# Patient Record
Sex: Male | Born: 1935 | Race: White | Hispanic: No | Marital: Married | Smoking: Former smoker
Health system: Southern US, Community
[De-identification: ages and names within clinical notes are randomized; demographics above are authoritative.]

## PROBLEM LIST (undated history)

## (undated) DIAGNOSIS — I219 Acute myocardial infarction, unspecified: Secondary | ICD-10-CM

## (undated) DIAGNOSIS — I509 Heart failure, unspecified: Secondary | ICD-10-CM

## (undated) DIAGNOSIS — I639 Cerebral infarction, unspecified: Secondary | ICD-10-CM

## (undated) DIAGNOSIS — M109 Gout, unspecified: Secondary | ICD-10-CM

## (undated) DIAGNOSIS — I255 Ischemic cardiomyopathy: Secondary | ICD-10-CM

## (undated) DIAGNOSIS — M1990 Unspecified osteoarthritis, unspecified site: Secondary | ICD-10-CM

## (undated) DIAGNOSIS — F0390 Unspecified dementia without behavioral disturbance: Secondary | ICD-10-CM

---

## 2004-12-23 ENCOUNTER — Ambulatory Visit: Payer: Self-pay | Admitting: Physical Medicine & Rehabilitation

## 2004-12-23 ENCOUNTER — Inpatient Hospital Stay (HOSPITAL_COMMUNITY): Admission: EM | Admit: 2004-12-23 | Discharge: 2005-01-01 | Payer: Self-pay | Admitting: Emergency Medicine

## 2004-12-24 ENCOUNTER — Encounter: Payer: Self-pay | Admitting: Cardiology

## 2004-12-24 ENCOUNTER — Ambulatory Visit: Payer: Self-pay | Admitting: Cardiology

## 2004-12-25 ENCOUNTER — Ambulatory Visit: Payer: Self-pay | Admitting: Internal Medicine

## 2005-01-01 ENCOUNTER — Ambulatory Visit: Payer: Self-pay | Admitting: Physical Medicine & Rehabilitation

## 2005-01-01 ENCOUNTER — Inpatient Hospital Stay (HOSPITAL_COMMUNITY)
Admission: RE | Admit: 2005-01-01 | Discharge: 2005-01-15 | Payer: Self-pay | Admitting: Physical Medicine & Rehabilitation

## 2005-07-31 ENCOUNTER — Emergency Department (HOSPITAL_COMMUNITY): Admission: EM | Admit: 2005-07-31 | Discharge: 2005-07-31 | Payer: Self-pay | Admitting: *Deleted

## 2005-11-15 ENCOUNTER — Emergency Department (HOSPITAL_COMMUNITY): Admission: EM | Admit: 2005-11-15 | Discharge: 2005-11-15 | Payer: Self-pay | Admitting: Emergency Medicine

## 2006-06-11 ENCOUNTER — Emergency Department (HOSPITAL_COMMUNITY): Admission: EM | Admit: 2006-06-11 | Discharge: 2006-06-11 | Payer: Self-pay | Admitting: Emergency Medicine

## 2006-11-19 ENCOUNTER — Emergency Department (HOSPITAL_COMMUNITY): Admission: EM | Admit: 2006-11-19 | Discharge: 2006-11-19 | Payer: Self-pay | Admitting: Emergency Medicine

## 2009-04-01 ENCOUNTER — Inpatient Hospital Stay (HOSPITAL_BASED_OUTPATIENT_CLINIC_OR_DEPARTMENT_OTHER): Admission: RE | Admit: 2009-04-01 | Discharge: 2009-04-01 | Payer: Self-pay | Admitting: Cardiology

## 2009-04-02 ENCOUNTER — Ambulatory Visit: Payer: Self-pay | Admitting: Surgery

## 2009-04-03 ENCOUNTER — Ambulatory Visit: Payer: Self-pay | Admitting: Vascular Surgery

## 2009-04-03 ENCOUNTER — Encounter: Payer: Self-pay | Admitting: Surgery

## 2009-04-03 ENCOUNTER — Ambulatory Visit (HOSPITAL_COMMUNITY): Admission: RE | Admit: 2009-04-03 | Discharge: 2009-04-03 | Payer: Self-pay | Admitting: Surgery

## 2009-04-09 ENCOUNTER — Ambulatory Visit: Payer: Self-pay | Admitting: Surgery

## 2009-04-26 HISTORY — PX: CARDIAC SURGERY: SHX584

## 2009-05-09 ENCOUNTER — Ambulatory Visit: Payer: Self-pay | Admitting: Surgery

## 2009-05-20 ENCOUNTER — Ambulatory Visit: Payer: Self-pay | Admitting: Surgery

## 2009-05-20 ENCOUNTER — Inpatient Hospital Stay (HOSPITAL_COMMUNITY): Admission: RE | Admit: 2009-05-20 | Discharge: 2009-06-03 | Payer: Self-pay | Admitting: Surgery

## 2009-05-24 ENCOUNTER — Ambulatory Visit: Payer: Self-pay | Admitting: Physical Medicine & Rehabilitation

## 2009-06-18 ENCOUNTER — Encounter: Admission: RE | Admit: 2009-06-18 | Discharge: 2009-06-18 | Payer: Self-pay | Admitting: Surgery

## 2009-06-18 ENCOUNTER — Ambulatory Visit: Payer: Self-pay | Admitting: Surgery

## 2009-07-23 ENCOUNTER — Ambulatory Visit: Payer: Self-pay | Admitting: Surgery

## 2009-07-23 ENCOUNTER — Encounter: Admission: RE | Admit: 2009-07-23 | Discharge: 2009-07-23 | Payer: Self-pay | Admitting: Surgery

## 2009-07-26 ENCOUNTER — Ambulatory Visit (HOSPITAL_COMMUNITY): Admission: RE | Admit: 2009-07-26 | Discharge: 2009-07-26 | Payer: Self-pay | Admitting: Cardiology

## 2009-09-23 ENCOUNTER — Observation Stay (HOSPITAL_COMMUNITY): Admission: RE | Admit: 2009-09-23 | Discharge: 2009-09-24 | Payer: Self-pay | Admitting: Cardiology

## 2009-09-23 HISTORY — PX: CARDIAC DEFIBRILLATOR PLACEMENT: SHX171

## 2009-11-14 ENCOUNTER — Encounter (HOSPITAL_COMMUNITY)
Admission: RE | Admit: 2009-11-14 | Discharge: 2010-02-12 | Payer: Self-pay | Source: Home / Self Care | Attending: Cardiology | Admitting: Cardiology

## 2010-02-13 ENCOUNTER — Encounter (HOSPITAL_COMMUNITY)
Admission: RE | Admit: 2010-02-13 | Discharge: 2010-02-25 | Payer: Self-pay | Source: Home / Self Care | Attending: Cardiology | Admitting: Cardiology

## 2010-02-26 ENCOUNTER — Ambulatory Visit (HOSPITAL_COMMUNITY): Payer: Self-pay

## 2010-02-28 ENCOUNTER — Ambulatory Visit (HOSPITAL_COMMUNITY): Payer: Self-pay

## 2010-03-03 ENCOUNTER — Ambulatory Visit (HOSPITAL_COMMUNITY): Payer: Self-pay

## 2010-03-05 ENCOUNTER — Ambulatory Visit (HOSPITAL_COMMUNITY): Payer: Self-pay

## 2010-03-07 ENCOUNTER — Ambulatory Visit (HOSPITAL_COMMUNITY): Payer: Self-pay

## 2010-03-10 ENCOUNTER — Ambulatory Visit (HOSPITAL_COMMUNITY): Payer: Self-pay

## 2010-03-12 ENCOUNTER — Ambulatory Visit (HOSPITAL_COMMUNITY): Payer: Self-pay

## 2010-03-14 ENCOUNTER — Ambulatory Visit (HOSPITAL_COMMUNITY): Payer: Self-pay

## 2010-03-17 ENCOUNTER — Ambulatory Visit (HOSPITAL_COMMUNITY): Payer: Self-pay

## 2010-03-19 ENCOUNTER — Ambulatory Visit (HOSPITAL_COMMUNITY): Payer: Self-pay

## 2010-03-21 ENCOUNTER — Ambulatory Visit (HOSPITAL_COMMUNITY): Payer: Self-pay

## 2010-03-24 ENCOUNTER — Ambulatory Visit (HOSPITAL_COMMUNITY): Payer: Self-pay

## 2010-03-26 ENCOUNTER — Ambulatory Visit (HOSPITAL_COMMUNITY): Payer: Self-pay

## 2010-03-28 ENCOUNTER — Ambulatory Visit (HOSPITAL_COMMUNITY): Payer: Self-pay

## 2010-03-31 ENCOUNTER — Ambulatory Visit (HOSPITAL_COMMUNITY): Payer: Self-pay

## 2010-04-02 ENCOUNTER — Ambulatory Visit (HOSPITAL_COMMUNITY): Payer: Self-pay

## 2010-04-04 ENCOUNTER — Ambulatory Visit (HOSPITAL_COMMUNITY): Payer: Self-pay

## 2010-04-07 ENCOUNTER — Ambulatory Visit (HOSPITAL_COMMUNITY): Payer: Self-pay

## 2010-04-09 ENCOUNTER — Ambulatory Visit (HOSPITAL_COMMUNITY): Payer: Self-pay

## 2010-04-11 ENCOUNTER — Ambulatory Visit (HOSPITAL_COMMUNITY): Payer: Self-pay

## 2010-04-11 LAB — PROTIME-INR
INR: 1.27 (ref 0.00–1.49)
Prothrombin Time: 16.1 seconds — ABNORMAL HIGH (ref 11.6–15.2)

## 2010-04-11 LAB — SURGICAL PCR SCREEN
MRSA, PCR: NEGATIVE
Staphylococcus aureus: POSITIVE — AB

## 2010-04-13 LAB — CBC
HCT: 41.7 % (ref 39.0–52.0)
Hemoglobin: 14.2 g/dL (ref 13.0–17.0)
MCH: 32.3 pg (ref 26.0–34.0)
MCHC: 34 g/dL (ref 30.0–36.0)
MCV: 95.1 fL (ref 78.0–100.0)
Platelets: 142 10*3/uL — ABNORMAL LOW (ref 150–400)
RBC: 4.39 MIL/uL (ref 4.22–5.81)
RDW: 14.2 % (ref 11.5–15.5)
WBC: 5.1 10*3/uL (ref 4.0–10.5)

## 2010-04-13 LAB — BASIC METABOLIC PANEL
BUN: 17 mg/dL (ref 6–23)
CO2: 24 mEq/L (ref 19–32)
Calcium: 9.1 mg/dL (ref 8.4–10.5)
Chloride: 107 mEq/L (ref 96–112)
Creatinine, Ser: 0.8 mg/dL (ref 0.4–1.5)
GFR calc Af Amer: >60 mL/min (ref >60)
GFR calc non Af Amer: >60 mL/min (ref >60)
Glucose, Bld: 100 mg/dL — ABNORMAL HIGH (ref 70–99)
Potassium: 4.4 mEq/L (ref 3.5–5.1)
Sodium: 139 mEq/L (ref 135–145)

## 2010-04-13 LAB — PROTIME-INR
INR: 2.29 — ABNORMAL HIGH (ref 0.00–1.49)
Prothrombin Time: 25 seconds — ABNORMAL HIGH (ref 11.6–15.2)

## 2010-04-13 LAB — MAGNESIUM: Magnesium: 2.2 mg/dL (ref 1.5–2.5)

## 2010-04-13 LAB — APTT: aPTT: 42 seconds — ABNORMAL HIGH (ref 24–37)

## 2010-04-14 ENCOUNTER — Ambulatory Visit (HOSPITAL_COMMUNITY): Payer: Self-pay

## 2010-04-15 LAB — POCT I-STAT, CHEM 8
BUN: 14 mg/dL (ref 6–23)
Calcium, Ion: 1.17 mmol/L (ref 1.12–1.32)
Creatinine, Ser: 0.7 mg/dL (ref 0.4–1.5)
HCT: 36 % — ABNORMAL LOW (ref 39.0–52.0)
Hemoglobin: 12.2 g/dL — ABNORMAL LOW (ref 13.0–17.0)
Potassium: 4.1 mEq/L (ref 3.5–5.1)
Sodium: 140 mEq/L (ref 135–145)
TCO2: 19 mmol/L (ref 0–100)
TCO2: 24 mmol/L (ref 0–100)

## 2010-04-15 LAB — POCT I-STAT 4, (NA,K, GLUC, HGB,HCT)
Glucose, Bld: 103 mg/dL — ABNORMAL HIGH (ref 70–99)
Glucose, Bld: 114 mg/dL — ABNORMAL HIGH (ref 70–99)
Glucose, Bld: 117 mg/dL — ABNORMAL HIGH (ref 70–99)
HCT: 31 % — ABNORMAL LOW (ref 39.0–52.0)
HCT: 32 % — ABNORMAL LOW (ref 39.0–52.0)
HCT: 37 % — ABNORMAL LOW (ref 39.0–52.0)
Hemoglobin: 10.9 g/dL — ABNORMAL LOW (ref 13.0–17.0)
Hemoglobin: 12.6 g/dL — ABNORMAL LOW (ref 13.0–17.0)
Hemoglobin: 9.5 g/dL — ABNORMAL LOW (ref 13.0–17.0)
Potassium: 3.7 mEq/L (ref 3.5–5.1)
Potassium: 4.7 mEq/L (ref 3.5–5.1)
Sodium: 138 mEq/L (ref 135–145)
Sodium: 142 mEq/L (ref 135–145)

## 2010-04-15 LAB — BASIC METABOLIC PANEL
BUN: 20 mg/dL (ref 6–23)
BUN: 22 mg/dL (ref 6–23)
BUN: 13 mg/dL (ref 6–23)
CO2: 26 mEq/L (ref 19–32)
CO2: 27 mEq/L (ref 19–32)
Calcium: 8.6 mg/dL (ref 8.4–10.5)
Calcium: 8 mg/dL — ABNORMAL LOW (ref 8.4–10.5)
Calcium: 8.2 mg/dL — ABNORMAL LOW (ref 8.4–10.5)
Calcium: 8.2 mg/dL — ABNORMAL LOW (ref 8.4–10.5)
Chloride: 106 mEq/L (ref 96–112)
Chloride: 109 mEq/L (ref 96–112)
Creatinine, Ser: 0.97 mg/dL (ref 0.4–1.5)
Creatinine, Ser: 1.05 mg/dL (ref 0.4–1.5)
Creatinine, Ser: 0.71 mg/dL (ref 0.4–1.5)
Creatinine, Ser: 0.72 mg/dL (ref 0.4–1.5)
GFR calc Af Amer: >60 mL/min (ref >60)
GFR calc Af Amer: >60 mL/min (ref >60)
GFR calc Af Amer: >60 mL/min (ref >60)
GFR calc non Af Amer: >60 mL/min (ref >60)
GFR calc non Af Amer: >60 mL/min (ref >60)
GFR calc non Af Amer: >60 mL/min (ref >60)
Glucose, Bld: 110 mg/dL — ABNORMAL HIGH (ref 70–99)
Glucose, Bld: 100 mg/dL — ABNORMAL HIGH (ref 70–99)
Glucose, Bld: 132 mg/dL — ABNORMAL HIGH (ref 70–99)
Potassium: 4.4 mEq/L (ref 3.5–5.1)
Potassium: 3.7 mEq/L (ref 3.5–5.1)
Potassium: 4.2 mEq/L (ref 3.5–5.1)
Sodium: 140 mEq/L (ref 135–145)
Sodium: 143 mEq/L (ref 135–145)

## 2010-04-15 LAB — GLUCOSE, CAPILLARY
Glucose-Capillary: 107 mg/dL — ABNORMAL HIGH (ref 70–99)
Glucose-Capillary: 109 mg/dL — ABNORMAL HIGH (ref 70–99)
Glucose-Capillary: 126 mg/dL — ABNORMAL HIGH (ref 70–99)
Glucose-Capillary: 131 mg/dL — ABNORMAL HIGH (ref 70–99)
Glucose-Capillary: 95 mg/dL (ref 70–99)

## 2010-04-15 LAB — BLOOD GAS, ARTERIAL
Bicarbonate: 21.8 mEq/L (ref 20.0–24.0)
Drawn by: 206361
FIO2: 0.21 %
pCO2 arterial: 32.9 mmHg — ABNORMAL LOW (ref 35.0–45.0)
pH, Arterial: 7.436 (ref 7.350–7.450)
pO2, Arterial: 86.7 mmHg (ref 80.0–100.0)

## 2010-04-15 LAB — CBC
HCT: 31.8 % — ABNORMAL LOW (ref 39.0–52.0)
HCT: 33.1 % — ABNORMAL LOW (ref 39.0–52.0)
HCT: 34.4 % — ABNORMAL LOW (ref 39.0–52.0)
Hemoglobin: 11.1 g/dL — ABNORMAL LOW (ref 13.0–17.0)
Hemoglobin: 11.5 g/dL — ABNORMAL LOW (ref 13.0–17.0)
Hemoglobin: 11.6 g/dL — ABNORMAL LOW (ref 13.0–17.0)
Hemoglobin: 12.6 g/dL — ABNORMAL LOW (ref 13.0–17.0)
Hemoglobin: 15 g/dL (ref 13.0–17.0)
MCHC: 35.3 g/dL (ref 30.0–36.0)
MCV: 97.8 fL (ref 78.0–100.0)
MCV: 98.2 fL (ref 78.0–100.0)
Platelets: 100 10*3/uL — ABNORMAL LOW (ref 150–400)
Platelets: 105 10*3/uL — ABNORMAL LOW (ref 150–400)
Platelets: 87 10*3/uL — ABNORMAL LOW (ref 150–400)
RBC: 3.29 MIL/uL — ABNORMAL LOW (ref 4.22–5.81)
RBC: 3.34 MIL/uL — ABNORMAL LOW (ref 4.22–5.81)
RBC: 3.71 MIL/uL — ABNORMAL LOW (ref 4.22–5.81)
RBC: 4.42 MIL/uL (ref 4.22–5.81)
RDW: 13.5 % (ref 11.5–15.5)
RDW: 14.2 % (ref 11.5–15.5)
RDW: 14.4 % (ref 11.5–15.5)
WBC: 5.4 10*3/uL (ref 4.0–10.5)
WBC: 8.1 10*3/uL (ref 4.0–10.5)
WBC: 8.7 10*3/uL (ref 4.0–10.5)
WBC: 8.8 10*3/uL (ref 4.0–10.5)
WBC: 9.5 10*3/uL (ref 4.0–10.5)

## 2010-04-15 LAB — URINALYSIS, ROUTINE W REFLEX MICROSCOPIC
Bilirubin Urine: NEGATIVE
Nitrite: NEGATIVE
Specific Gravity, Urine: 1.023 (ref 1.005–1.030)
pH: 5 (ref 5.0–8.0)

## 2010-04-15 LAB — APTT
aPTT: 36 seconds (ref 24–37)
aPTT: 37 seconds (ref 24–37)

## 2010-04-15 LAB — POCT I-STAT 3, ART BLOOD GAS (G3+)
Acid-base deficit: 3 mmol/L — ABNORMAL HIGH (ref 0.0–2.0)
Acid-base deficit: 3 mmol/L — ABNORMAL HIGH (ref 0.0–2.0)
Bicarbonate: 21.4 mEq/L (ref 20.0–24.0)
Bicarbonate: 21.6 mEq/L (ref 20.0–24.0)
O2 Saturation: 85 %
O2 Saturation: 97 %
O2 Saturation: 99 %
TCO2: 22 mmol/L (ref 0–100)
TCO2: 22 mmol/L (ref 0–100)
pCO2 arterial: 30.2 mmHg — ABNORMAL LOW (ref 35.0–45.0)
pCO2 arterial: 32.2 mmHg — ABNORMAL LOW (ref 35.0–45.0)
pH, Arterial: 7.397 (ref 7.350–7.450)
pH, Arterial: 7.413 (ref 7.350–7.450)
pO2, Arterial: 111 mmHg — ABNORMAL HIGH (ref 80.0–100.0)
pO2, Arterial: 51 mmHg — ABNORMAL LOW (ref 80.0–100.0)
pO2, Arterial: 66 mmHg — ABNORMAL LOW (ref 80.0–100.0)

## 2010-04-15 LAB — CREATININE, SERUM
Creatinine, Ser: 0.62 mg/dL (ref 0.4–1.5)
Creatinine, Ser: 0.79 mg/dL (ref 0.4–1.5)
GFR calc Af Amer: >60 mL/min (ref >60)
GFR calc Af Amer: >60 mL/min (ref >60)

## 2010-04-15 LAB — PROTIME-INR
INR: 0.96 (ref 0.00–1.49)
INR: 1.04 (ref 0.00–1.49)
INR: 1.04 (ref 0.00–1.49)
INR: 1.75 — ABNORMAL HIGH (ref 0.00–1.49)
INR: 1.37 (ref 0.00–1.49)
Prothrombin Time: 12.7 seconds (ref 11.6–15.2)
Prothrombin Time: 13.5 seconds (ref 11.6–15.2)

## 2010-04-15 LAB — COMPREHENSIVE METABOLIC PANEL
ALT: 14 U/L (ref 0–53)
Alkaline Phosphatase: 53 U/L (ref 39–117)
Chloride: 110 mEq/L (ref 96–112)
Glucose, Bld: 101 mg/dL — ABNORMAL HIGH (ref 70–99)
Potassium: 4.4 mEq/L (ref 3.5–5.1)
Sodium: 138 mEq/L (ref 135–145)
Total Bilirubin: 1.3 mg/dL — ABNORMAL HIGH (ref 0.3–1.2)
Total Protein: 6.5 g/dL (ref 6.0–8.3)

## 2010-04-15 LAB — MAGNESIUM
Magnesium: 2.2 mg/dL (ref 1.5–2.5)
Magnesium: 2.3 mg/dL (ref 1.5–2.5)
Magnesium: 2.7 mg/dL — ABNORMAL HIGH (ref 1.5–2.5)

## 2010-04-15 LAB — HEMOGLOBIN AND HEMATOCRIT, BLOOD
HCT: 33.9 % — ABNORMAL LOW (ref 39.0–52.0)
HCT: 28.7 % — ABNORMAL LOW (ref 39.0–52.0)
Hemoglobin: 10.2 g/dL — ABNORMAL LOW (ref 13.0–17.0)

## 2010-04-15 LAB — HEMOGLOBIN A1C: Hgb A1c MFr Bld: 5.5 % (ref <5.7)

## 2010-04-15 LAB — PLATELET COUNT: Platelets: 107 10*3/uL — ABNORMAL LOW (ref 150–400)

## 2010-04-15 LAB — TYPE AND SCREEN: ABO/RH(D): A NEG

## 2010-04-15 LAB — SURGICAL PCR SCREEN: MRSA, PCR: NEGATIVE

## 2010-04-16 ENCOUNTER — Ambulatory Visit (HOSPITAL_COMMUNITY): Payer: Self-pay

## 2010-04-18 ENCOUNTER — Ambulatory Visit (HOSPITAL_COMMUNITY): Payer: Self-pay

## 2010-04-21 ENCOUNTER — Ambulatory Visit (HOSPITAL_COMMUNITY): Payer: Self-pay

## 2010-04-23 ENCOUNTER — Ambulatory Visit (HOSPITAL_COMMUNITY): Payer: Self-pay

## 2010-04-25 ENCOUNTER — Ambulatory Visit (HOSPITAL_COMMUNITY): Payer: Self-pay

## 2010-04-28 ENCOUNTER — Ambulatory Visit (HOSPITAL_COMMUNITY): Payer: Self-pay

## 2010-04-30 ENCOUNTER — Ambulatory Visit (HOSPITAL_COMMUNITY): Payer: Self-pay

## 2010-05-02 ENCOUNTER — Ambulatory Visit (HOSPITAL_COMMUNITY): Payer: Self-pay

## 2010-05-05 ENCOUNTER — Ambulatory Visit (HOSPITAL_COMMUNITY): Payer: Self-pay

## 2010-05-07 ENCOUNTER — Ambulatory Visit (HOSPITAL_COMMUNITY): Payer: Self-pay

## 2010-05-09 ENCOUNTER — Ambulatory Visit (HOSPITAL_COMMUNITY): Payer: Self-pay

## 2010-05-12 ENCOUNTER — Ambulatory Visit (HOSPITAL_COMMUNITY): Payer: Self-pay

## 2010-05-14 ENCOUNTER — Ambulatory Visit (HOSPITAL_COMMUNITY): Payer: Self-pay

## 2010-05-16 ENCOUNTER — Ambulatory Visit (HOSPITAL_COMMUNITY): Payer: Self-pay

## 2010-05-19 ENCOUNTER — Ambulatory Visit (HOSPITAL_COMMUNITY): Payer: Self-pay

## 2010-06-10 NOTE — Consult Note (Signed)
NEW PATIENT CONSULTATION   Jorge Klein, Jorge Klein  DOB:  May 25, 1927                                        April 02, 2009  CHART #:  [id number]   REASON FOR CONSULTATION:  Severe three-vessel coronary disease with  severe left ventricular dysfunction.   CLINICAL HISTORY:  I was asked by Dr. Mayford Knife to evaluate the patient for  consideration of coronary artery bypass graft surgery.  He is a 77-year-  old gentleman with no prior cardiac history who was apparently evaluated  by his primary physician Dr. Merlene Laughter for lower extremity edema.  He was referred to Dr. Armanda Magic for cardiology evaluation.  An  echocardiogram on March 11, 2009 showed moderately reduced left  ventricular function with an ejection fraction of 25-30% with multiple  wall motion abnormalities involving the anterior, septal, and apical  walls with akinesis.  There was mild mitral valve regurgitation.  There  was mild aortic valve regurgitation.  There was a good leaflet motion  without any evidence of stenosis, but was some calcification of the  aortic valve.  Ejection fraction was estimated 25-30%.  Right  ventricular function was normal.  The patient has also underwent a  nuclear stress test, which suggested possible old anterior infarct with  reduced left ventricular ejection fraction.  He then underwent cardiac  catheterization on April 01, 2009, which showed severe three-vessel  coronary disease.  The LAD was occluded at its ostium with filling of  the distal vessel slowly by collaterals from the right.  The left  circumflex had a diffuse 80% proximal stenosis.  There was a small  intermediate vessel had about 80% proximal stenosis.  The right coronary  artery had 75-80% segmental stenosis in the mid to distal portion and  then before the posterior descending branch and then about 60-70%  stenosis just after the posterior descending branch before 2  posterolateral branches.  Left  ventriculogram was not performed due to  elevated left ventricular end-diastolic pressure.  There was no gradient  across the aortic valve.   REVIEW OF SYSTEMS:  As follows:  GENERAL:  He denies any fever or chills.  He has had no recent weight  changes.  He denies fatigue.  EYES:  Negative.  ENT:  Negative.  ENDOCRINE:  He denies diabetes and hypothyroidism.  CARDIOVASCULAR:  He denies any chest pain or pressure.  He denies PND  and orthopnea.  He occasionally has some exertional shortness of breath  with walking upstairs.  He denies any palpitations.  He does have  peripheral edema.  RESPIRATORY:  He denies cough and sputum production.  GI:  He has had no nausea or vomiting.  He denies melena and bright red  blood per rectum.  He has occasional constipation.  GU:  He denies dysuria and hematuria.  MUSCULOSKELETAL:  He denies arthralgias and myalgias.  VASCULAR:  He denies claudication and phlebitis.  NEUROLOGICAL:  He had a stroke in 2006 and has known carotid occlusion  on one side.  His stroke mainly affected his ability to talk, but that  improved.  PSYCHIATRIC:  Negative.  HEMATOLOGICAL:  Negative.   ALLERGIES:  None.   MEDICATIONS:  Lasix 20 mg p.r.n. edema, Altace 2.5 mg daily, Coumadin 5  mg daily, and fish oil 1000 mg b.i.d.   PAST MEDICAL HISTORY:  Significant for stroke in 2006.  There is a  questionable history of gout.  He has had no prior surgery or  hospitalizations other than for his stroke.   SOCIAL HISTORY:  He is married and lives with his wife.  He continues to  work in [employer].  He is a lifelong nonsmoker and denies  alcohol use.  He has a son, Merlyn Albert and daughter, Tamela Oddi who is here with  him today.   FAMILY HISTORY:  His father died of possible kidney disease.  His mother  died of unknown causes in her late 71s.  He has one sister who is alive  and well at age 53.   PHYSICAL EXAMINATION:  Vital Signs:  Blood pressure is 142/75 and  pulse  is 50 and regular.  Respiratory rate 16, unlabored.  Oxygen saturation  is 95% on room air.  General:  He is an elderly white male, in no  distress.  HEENT:  Normocephalic and atraumatic.  Pupils are equal and  reactive to light and accommodation.  Extraocular muscles are intact.  Throat is clear.  Neck:  Normal carotid pulses bilaterally.  There are  no bruits.  There is no adenopathy or thyromegaly.  Cardiac:  Regular  rate and rhythm with normal S1 and S2.  There is no murmur, rub, or  gallop.  Lungs:  Clear.  Abdomen:  Active bowel sounds.  His abdomen is  soft and nontender.  There are no palpable masses or organomegaly.  Extremities:  Mild-to-moderate edema in both ankles.  Pedal pulses are  palpable bilaterally.  Skin:  Warm and dry.  Neurologic:  Alert and  oriented x3.  Motor and sensory exams are grossly normal.  He is walking  holding onto a walker, but is able to walk without it.   LABORATORY EXAMINATION:  Normal electrolytes with a BUN of 18 and  creatinine of 0.8.  His hemoglobin is 15.7 and hematocrit is 46.6.  His  coagulation profile is within normal limits.  Electrocardiogram shows  marked sinus bradycardia at a rate of 44 with first-degree AV block.  There is nonspecific QRS widening with a QRS duration of 126 msec.  There is evidence of old septal infarct.  There are nonspecific ST and T-  wave changes.   IMPRESSION:  The patient has severe three-vessel coronary disease with  moderate to severe left ventricular dysfunction.  He has significant  myocardial territory at risk.  He denies essentially all symptoms,  although he does have some lower extremity edema consistent with  congestive heart failure.  I think, coronary artery bypass graft surgery  is the best option for trying to improve his cardiac function and  prevent further deterioration and worsening congestive heart failure  symptoms.  I suspect he probably would have more symptoms if he was more   active.  I think, his personality also lends to denying symptoms.  I am  concerned about his history of stroke and possible carotid occlusion on  one side.  We will obtain carotid duplex to evaluate his carotid  arteries.  His operative risk would be increased due to his age and  prior stroke as well as left ventricular dysfunction, but I think he is  probably still an acceptable risk  assuming he does not have a high-  grade carotid stenosis on the other side.  I discussed surgery with the  patient and his wife and daughter including alternatives, benefits, and  risks.  I told  them that we would obtain preoperative carotid Dopplers  and I will see him back in the office to discuss the results with him  and answer any further questions before making a decision about surgery.  I will try to obtain more information about his hospitalization for  stroke in 2006.   Evelene Croon, [name]  Electronically Signed   BB/MEDQ  D:  04/02/2009  T:  04/03/2009  Job:  [id number]   cc:   Armanda Magic, [name]  Hal T. Stoneking, [name]

## 2010-06-10 NOTE — Assessment & Plan Note (Signed)
OFFICE VISIT   BLISS, TSANG  DOB:  05/27/1927                                        Jun 18, 2009  CHART #:  [id number]   HISTORY:  The patient returned to my office today for followup status  post coronary artery bypass graft surgery x4 on May 20, 2009.  He had  a fairly slow postoperative course due to his general debilitation and  some confusion, which improved.  Just before he was ready to be  discharged on May 29, 2009, he went into atrial fibrillation and atrial  flutter with a rapid ventricular rate in the 130s.  He was started on  Coreg and anticoagulated with Coumadin.  He was discharged to  [facility] on Jun 03, 2009, in atrial flutter with a  ventricular rate in the 60-70 range.  He said that since discharge, he  has continued to improve.  He has now returned home.  He is walking  short distances without chest pain or shortness of breath.  He has  noticed no tachy palpitations.   PHYSICAL EXAMINATION:  Today, his blood pressure is 107/72, pulse is 64  and regular, and respiratory rate is 16, unlabored.  Oxygen saturation  on room air is 96%.  He looks well overall.  He is moving a little  slower than he did preoperatively.  Cardiac exam shows a regular rate  and rhythm with normal heart sounds.  His lung exam reveals slight  decreased breath sounds at the left base.  Chest incision is healing  well and the sternum is stable.  His leg incision is healing well.  There is no lower extremity edema.   DIAGNOSTIC TESTS:  Followup chest x-ray today shows very small left  pleural effusion, which is new compared to his previous x-rays.  Lungs  are otherwise clear.   MEDICATIONS:  1. Coreg 12.5 mg b.i.d.  2. Ultram 50 mg p.r.n. for pain.  3. Lovaza 2 g b.i.d.  4. MiraLax p.r.n.  5. Tylenol p.r.n.  6. Vitamin B and D as well as multivitamin.  7. Aspirin 81 mg daily.  8. Coumadin 5 mg daily.  9. Lasix 20 mg p.r.n.   IMPRESSION:  Overall, the patient is making a good recovery following  his surgery.  His mental status appears to be improving and he is moving  around better than he did at the time of discharge.  I suspect he will  continue to improve at home with physical therapy.  He is going to  continue followup with Dr. Armanda Magic.  I will plan to see him back in  about 1 month.  I told him he could increase his activity somewhat and  may get in the swimming pool for exercise, but should refrain from  lifting anything heavier than 10 pounds for a total of 3 months from  date of surgery.  His wife said that he no longer drives and I have  recommended that he not try to start driving again.   Evelene Croon, [name]  Electronically Signed   BB/MEDQ  D:  06/18/2009  T:  06/18/2009  Job:  [id number]   cc:   Armanda Magic, [name]

## 2010-06-10 NOTE — Assessment & Plan Note (Signed)
OFFICE VISIT   CLEMENTE, DEWEY  DOB:  1927-06-23                                        April 09, 2009  CHART #:  [id number]   HISTORY:  The patient returned to my office today for further evaluation  and discussion of possible coronary artery bypass surgery.  Please see  my new patient consultation from April 02, 2009 for his full history and  physical.  He was sent for carotid Doppler examination, which shows no  evidence of internal carotid artery stenosis on either side.  It is  interesting that his family reports him having an occluded carotid on  one side.  This clearly not seen on the study.  He has had no changes in  his clinical condition since I last saw him.   PHYSICAL EXAMINATION:  Blood pressure is 142/77, pulse is 46 and  regular, respiratory rate is 18 and unlabored.  Oxygen saturation on  room air is 96%.  He looks comfortable.  Cardiac exam shows regular rate  and rhythm with normal S1 and S2.  There is no murmur, rub, or gallop.  Lungs are clear.  There is mild bilateral ankle edema.   IMPRESSION:  The patient has severe three-vessel coronary disease with  moderate to severe left ventricular dysfunction with an ejection  fraction of 25-30% by echocardiogram.  I think coronary artery bypass  graft surgery is going to be the best option for trying to improve his  cardiac function and preventing further myocardial loss and worsening  congestive heart failure symptoms.  I discussed the option of coronary  bypass surgery with the patient and his wife and daughter.  We discussed  the alternative of not performing any surgery.  We discussed the  benefits and risks of surgery including, but not limited to bleeding,  blood transfusion, infection,  stroke, myocardial infarction, organ dysfunction, and death.  He  understands and would like to proceed with surgery.  We have tentatively  plan this for Monday, May 20, 2009.   Evelene Croon, [name]  Electronically Signed   BB/MEDQ  D:  04/09/2009  T:  04/10/2009  Job:  [id number]   cc:   Armanda Magic, [name]  Hal T. Stoneking, [name]

## 2010-06-10 NOTE — Assessment & Plan Note (Signed)
OFFICE VISIT   Jorge Klein, Jorge Klein  DOB:  10-27-1927                                        July 23, 2009  CHART #:  [id number]   HISTORY:  The patient returned to my office today for followup status  post coronary bypass surgery on May 20, 2009.  He is with his wife and  daughter in the office today.  His daughter said that he has not been  very active at home and is basically sitting around in bed, not doing  very much.  He says he still feels weak at times.  He has had no chest  pain or pressure.  He denies any shortness of breath.   PHYSICAL EXAMINATION:  His blood pressure is 117/68, pulse is 66 and  irregular.  Respiratory rate is 16, unlabored.  Oxygen saturation on  room air is 95%.  He looks about the same as he did preoperatively.  He  is in no distress.  Cardiac exam shows an irregular rate and rhythm.  His lungs reveal decreased breath sounds in the bases.  The chest  incision is well healed and the sternum is stable.  There is moderate  right lower extremity edema to above the knee and mild left lower  extremity edema.   DIAGNOSTIC TESTS:  Followup chest x-ray today shows small left pleural  effusion adjacent to left base atelectasis.  There is cardiomegaly  without congestive heart failure.   IMPRESSION:  The patient is making slow progress following his surgery,  which is probably related to his advanced age as well as debilitation  and difficulty ambulating preoperatively.  I have strongly recommended  that he attend cardiac rehab and his daughter is planning on making a  call to them today.  The patient is somewhat resistant to that, but I  think it will help him a lot.  He does have significant edema  particularly in his right lower extremity, which is worse in the left  side.  This is most likely due to volume excess as well as saphenous  vein harvest on the right side.  I asked him to resume his Lasix 20 mg  daily, which he had been  on chronically.  This will require monitoring  to decide if he needs a higher dose.  He is scheduled for a  cardioversion this  Friday with Dr. Armanda Magic.  He will continue to follow up with her,  and I told him that he did not need to return to see me unless he  develops problem with his incisions.   Evelene Croon, [name]  Electronically Signed   BB/MEDQ  D:  07/23/2009  T:  07/24/2009  Job:  [id number]   cc:   Armanda Magic, [name]

## 2010-06-10 NOTE — Assessment & Plan Note (Signed)
OFFICE VISIT   Jorge Klein, Jorge Klein  DOB:  [birth year]                                        May 09, 2009  CHART #:  [id number]   HISTORY:  The patient returned today for further discussion of planned  coronary artery bypass graft surgery on May 20, 2009.  He has been  feeling fairly well overall without chest pain or shortness of breath.  He has continued to go to the [facility] and has been riding a stationary  bicycle despite having been told by me before not to do that.   PHYSICAL EXAMINATION:  Today, blood pressure 136/72, pulse 53 and  regular, respiratory rate is 18 and unlabored.  Oxygen saturation on  room air is 95%.  He looks well.  Cardiac exam shows regular rate and  rhythm with normal heart sounds.  His lung exam is clear.  There is mild  bilateral ankle edema.   IMPRESSION:  Overall, the patient remains stable with severe three-  vessel coronary disease and moderate-to-severe left ventricular  dysfunction with an ejection fraction of 25% to 30%.  We are planning on  doing coronary bypass surgery on Monday, May 20, 2009.  I reviewed the  procedure again with him and his wife and daughter.  I answered their  questions completely.  We discussed alternatives, benefits, and risks  including but not limited to bleeding, blood transfusion, infection,  stroke, myocardial infarction, organ dysfunction, and death.  He  understands all this and agrees to proceed.  I told him to stop taking  his Coumadin and fish oil as of May 14, 2009, in preparation for  surgery.   Evelene Croon, [name]  Electronically Signed   BB/MEDQ  D:  05/09/2009  T:  05/10/2009  Job:  [id number]

## 2010-06-13 NOTE — H&P (Signed)
NAME:  Jorge Klein, SAAB NO.:  [id number]   MEDICAL RECORD NO.:  [id number]          PATIENT TYPE:  EMS   LOCATION:  [facility]                         FACILITY:  [facility]   PHYSICIAN:  Marlan Palau, [name]  DATE OF BIRTH:  April 26, 1927   DATE OF ADMISSION:  12/23/2004  DATE OF DISCHARGE:                                HISTORY & PHYSICAL   HISTORY OF PRESENT ILLNESS:  Jorge Klein. Jorge Klein is a 77 year old right-handed  white male born 05-21-1927, with a history of onset of speech disturbance  that occurred at some point today. The patient was found by the wife early  this morning with problems with speech. The time of onset is unknown. The  patient initially did not want to go to the hospital but eventually was  talked into an evaluation through the emergency room. The patient had no  obvious numbness or weakness or visual changes. CT of the head is pending at  this point, but neurology was asked to see this patient for further  evaluation. The patient has had no prior problems with strokes or transient  speech disturbance in the past. The patient was on no medications.   PAST MEDICAL HISTORY:  Significant for:  1.  New onset of aphasia, anomic type primarily.  2.  Right knee surgery in the distant past.   The patient is on no medications, has no known allergies. Does not smoke or  drink.   SOCIAL HISTORY:  The patient is married, lives in the [place] area, works  as an Advertising account planner. The patient has two children, a son and a daughter,  who are alive and well.   FAMILY MEDICAL HISTORY:  Notable that mother died of old age in her 85s.  Father died with heart disease and MI. The patient has one sister who is  alive and well other than having some osteoporosis. History of stroke in a  paternal aunt.   REVIEW OF SYSTEMS:  Notable for no recent fevers or chills. The patient  denies headache, neck pain, shortness of breath, chest pains, abdominal  pain, nausea,  vomiting, troubles controlling the bowels or bladder, numbness  or weakness on the arms or legs, visual disturbance, balance problems.   PHYSICAL EXAMINATION:  VITAL SIGNS:  Blood pressure is 148/82, heart rate  50, respiratory rate 22, temperature afebrile.  GENERAL:  This patient is a moderately-obese white male who is alert and  cooperative at the time of examination.  HEENT:  Head is atraumatic. Eyes:  Pupils equal, round, and reactive to  light. Discs are flat bilaterally.  NECK:  Supple. No carotid bruits noted.  RESPIRATORY:  Clear.  CARDIOVASCULAR:  Reveals a regular rate and rhythm with no obvious murmurs  or rubs noted.  ABDOMEN:  Obese, positive bowel sounds noted. No organomegaly or tenderness  noted.  EXTREMITIES:  Revealed trace edema at the ankles bilaterally.  NEUROLOGIC:  Cranial nerves as above. Facial symmetry is present. The  patient has good sensation of the face to pinprick and soft touch  bilaterally, has good strength to facial muscles and the  muscles of head-  turning and shoulder shrug bilaterally. Speech is well enunciated, not  dysarthric. The patient has good repetition, is able to follow pure verbal  commands with only minimal errors. Has some trouble with naming. Again,  repetition is excellent. The patient had some difficulty following three-  step commands. Motor testing reveals 5/5 strength in all fours. Good  symmetric motor tone is noted throughout. Questionable minimal right upper  extremity drift noted. The patient has good pinprick, soft touch, and  vibratory sensation throughout. The patient has good finger-nose-finger, toe-  to-finger bilaterally. Gait was not tested. Deep tendon reflexes were  depressed but symmetric. Toes are neutral bilaterally.   LABORATORY VALUES:  Notable for a white count of 6.9, hemoglobin of 15.6,  hematocrit of 44.6, MCV of 92.5, platelets of 170. INR of 0.9. Sodium of  138, potassium 4.0, chloride of 109, CO2 of 24,  glucose of 100, BUN of 20,  creatinine 0.8, total bili 0.9, alkaline phosphatase 55, SGOT of 16, SGPT of  13, total protein 6.1, albumin of 3.5, calcium 9.2.   Again, CT of the head, chest x-ray, and EKG are pending.   IMPRESSION:  New onset of anomic aphasia.   This patient has no significant past medical history. Family claims that he  really does not go to the doctor much. His primary doctor is Dr. Suezanne Jacquet but,  again, the patient does not see him on a regular basis. 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 The patient has no other significant deficits at this point.   PLAN:  1.  The patient will be admitted to [facility]. The patient is not      a tPA candidate due to the fact that the time of onset was unknown.  2.  IV fluids.  3.  Aspirin therapy.  4.  MRI of the brain.  5.  MR angiogram of the intracranial and extracranial vessels.  6.  A 2-D echocardiogram.  7.  Speech therapy evaluation for speech and language evaluation.   The patient will be followed closely while in-house.      Marlan Palau, [name]  Electronically Signed     CKW/MEDQ  D:  12/23/2004  T:  12/23/2004  Job:  [id number]   cc:   Dr. Suezanne Jacquet

## 2010-06-13 NOTE — Discharge Summary (Signed)
NAMECOLEMAN, KALAS NO.:  [id number]   MEDICAL RECORD NO.:  [id number]          PATIENT TYPE:  IPS   LOCATION:  4004                         FACILITY:  [facility]   PHYSICIAN:  Ranelle Oyster, M.D.DATE OF BIRTH:  1927/02/10   DATE OF ADMISSION:  01/01/2005  DATE OF DISCHARGE:                                 DISCHARGE SUMMARY   DISCHARGE DIAGNOSES:  1.  Left middle cerebral artery infarction.  2.  Left internal carotid artery occlusion.  3.  Pain management.  4.  Coumadin therapy for left middle cerebral artery infarction.  5.  Hyperlipidemia.  6.  Hypertension.  7.  Bradycardia.  8.  Pseudogout to the knees.   HISTORY OF PRESENT ILLNESS:  This is a 77 year old white male, unremarkable  past history, admitted December 23, 2004, with slurred speech. CT of the  head with interval evolution of ischemic changes left middle cerebral  artery. MRI positive for left middle cerebral artery infarction. Also a  small area of Infarction left occipital parietal lobe. Echocardiogram with  ejection fraction 30-40%. Cardiology services, Dr. Dietrich Pates, for bradycardia  in the 40s. Recommended no intervention at this time. Carotid duplex with  left internal carotid artery occlusion. Placed on intravenous heparin and  Coumadin therapy. Cardiology to follow up outpatient for left ventricular  dysfunction per echocardiogram as the patient with no evidence of ongoing  ischemia. Orthopedic consult December 4 for bilateral knee pain. X-rays with  profound tricompartmental degenerative joint disease. Aspiration of knee  showed no growth and conservative care provided. Full workup per orthopedic  services for pseudogout. The patient was admitted for a comprehensive rehab  program.   PAST MEDICAL HISTORY:  See discharge diagnoses. No alcohol or tobacco.   ALLERGIES:  None.   MEDICATIONS PRIOR TO ADMISSION:  None.   SOCIAL HISTORY:  Lives with his wife and assistance as needed.  Two-level  home, multiple entry steps.   REHABILITATION HOSPITAL COURSE:  The patient was admitted to inpatient rehab  services with therapies initiated twice daily consisting of physical  therapy, occupational therapy, speech therapy, and rehabilitation nursing.  The following issues were addressed during the patient's rehabilitation  stay. Concerning Mr. Conran left middle cerebral artery infarction,  remained stable. The patient with ongoing bouts of confusion, poor judgment,  limited awareness. A waist belt was in place for safety. During workup of  cerebral vascular accident noted a left internal carotid artery occlusion.  It was advised that the patient remain on Coumadin therapy with latest INR  of 2.3. There were no bleeding episodes. His blood pressures were monitored  and relatively stable with diastolic 66-78. He remained on Altace and Lasix.  Concerning his bradycardia, he had been seen by cardiology services with  echocardiogram noting no evidence of ongoing ischemia. He could be seen as  an outpatient if needed by Dr. Dietrich Pates of cardiology services, [phone],  for ongoing care as needed. It was advised that he did continue with his  Coumadin therapy. He had ongoing issues of bilateral knee pain. He had been  seen early on in his acute hospital course  by Dr. Charlann Boxer for felt to be  pseudogout to the knees. Celebrex was initiated. He did receive injections  of the knees by Dr. Riley Kill on December 19 with good results. His mobility  had improved after injections. His Celebrex was discontinued and, again,  monitored. He was ambulating close supervision greater than 50 feet with a  rolling walker, simple setup for activities of daily living except needing  minimal assist for lower body dressing. Due to the fact that the patient's  awareness and judgment remained poor, behavior monitored with waist belt in  place. He continued with his Seroquel 25 mg at bedtime as well as Xanax  on  an as-needed basis. It was felt that the patient would need 24-hour care;  thus, skilled nursing facility bed became available on January 16, 2005,  and discharge took place at that time with the patient medically stable.   Latest labs showed an INR of 2.3. Hemoglobin 13.8; hematocrit 39.5;  platelets 256,000; WBC 7.3. Sodium 138, potassium 4.0, BUN 23, creatinine  1.0.   DISCHARGE MEDICATIONS AT TIME OF DICTATION:  1.  Coumadin 5 mg with goal INR to be 2.0 to 3.0.  2.  Foltx one tablet p.o. daily.  3.  Zocor 20 mg p.o. daily.  4.  Seroquel 25 mg p.o. q.h.s.  5.  Aspirin 81 mg p.o. daily.  6.  Altace 2.5 mg p.o. daily.  7.  Lasix 20 mg p.o. daily.  8.  Senokot tablets two p.o. q.h.s.  9.  Vicodin 5/500 one or two tablets every 4 hours as needed pain.  10. Xanax 0.5 mg p.o. q.8h. as needed.   ACTIVITY:  As tolerated. Safety with waist belt for the patient's safety  when unsupervised or unattended.   DIET:  Regular.   SPECIAL INSTRUCTIONS:  The patient should continue to receive weekly  prothrombin times while on Coumadin therapy with goal INR to be 2.0 to 3.0.  The patient should follow up with Dr. Faith Rogue at the outpatient rehab  service office, [phone], please call for appointment; Dr. Willette Cluster  Cardiology services for outpatient follow-up concerning his bradycardia if  the patient was symptomatic.      Mariam Dollar, P.A.      Ranelle Oyster, [name]  Electronically Signed    DA/MEDQ  D:  01/15/2005  T:  01/15/2005  Job:  [id number]   cc:   Long Barn Bing, [name] [facility]  [address]  [address]  [place]  [place] [address]   Pramod P. Pearlean Brownie, MD  Fax: [phone]   Madlyn Frankel. Charlann Boxer, [name]  Fax: [phone]

## 2010-06-13 NOTE — Discharge Summary (Signed)
NAMEKAICEN, DESENA              ACCOUNT NO.:  [id number]   MEDICAL RECORD NO.:  [id number]          PATIENT TYPE:  INP   LOCATION:  3031                         FACILITY:  [facility]   PHYSICIAN:  Pramod P. Pearlean Brownie, MD    DATE OF BIRTH:  10/03/1927   DATE OF ADMISSION:  12/23/2004  DATE OF DISCHARGE:  01/01/2005                                 DISCHARGE SUMMARY   ADMISSION DIAGNOSIS:  Speech disturbance.   DISCHARGE DIAGNOSIS:  1.  Left middle cerebral artery branch infarction secondary to embolization      from atrial fibrillation and left ventricular dyskinesia.  2.  Elevated homocystine  3.  Hyperlipidemia.  4.  Obesity.  5.  Pseudogout with bilateral knee effusion.   HOSPITAL COURSE:  Mr. Eoff is a 77-year obese Caucasian male who was  admitted with history of speech disturbance on the day of admission. The  exact time of onset was not clear but had been some time in the morning. The  patient initially refused to go to the hospital but eventually the wife  convinced him to come to the emergency room. On exam at that point, he had  no focal deficits except some word-finding difficulties and expression and  speech difficulties. CT scan of the head was unremarkable. The patient was  admitted to the stroke service. He was not a candidate for IV thrombolysis  secondary to unclear time of onset. He was started on IV heparin secondary  with his speech symptoms got worse and CT scan showed acute infarcts in the  left frontal lobes. He also developed severe bradycardia during the  hospitalization and hence cardiology was consulted and 2-D echocardiogram  showed moderate left ventricular dysfunction. It was not clear whether this  was a result of occult coronary artery disease and perhaps could be a  cardiac source of emboli. The patient's lipid profile showed cholesterol  180, LDL of 120, HDL of 33. Homocystine was 17.5, hemoglobin A1c was 5.3. A  2-D echocardiogram showed ejection  fraction of 30-40% with severe  hypokinesis of the anterior walls. The patient was started on Zocor for  elevated lipids and Foltx for elevated homocystine. He was started on  Coumadin secondary to emboli. The patient subsequently developed pain and  swelling in both knees that raised the question of hemarthrosis and  orthopedic service was consulted. They did a bilateral knee aspiration and  120 cc of clear synovial fluid from the right and 80 cc from the left. The  patient still had significant knee pain and his ambulation was poor; hence,  physical and occupation therapy as well as rehab was consulted. He was felt  to be a good candidate for rehab and was transferred to rehab on January 01, 2005. He was advised to follow-up with Dr. Pearlean Brownie in his office in two months  and with Dr. Dietrich Pates, cardiologist, for further cardiac workup for his  severe LV dysfunction. He will also follow-up with his primary physician,  Dr. Suezanne Jacquet as needed. He will stay on Coumadin for a period of two to three  months to prevent emboli  and subsequently stop that and changed to aspirin.           ______________________________  Sunny Schlein. Pearlean Brownie, MD     PPS/MEDQ  D:  03/16/2005  T:  03/16/2005  Job:  [id number]

## 2010-06-13 NOTE — Consult Note (Signed)
NAMECHIRSTOPHER, IOVINO NO.:  [id number]   MEDICAL RECORD NO.:  [id number]          PATIENT TYPE:  INP   LOCATION:  3031                         FACILITY:  [facility]   PHYSICIAN:  Belfield Bing, [name] [facility] OF BIRTH:  [birth year]   DATE OF CONSULTATION:  12/24/2004  DATE OF DISCHARGE:                                   CONSULTATION   REFERRING PHYSICIAN:  Dr. Lesia Sago.   PRIMARY CARE PHYSICIAN:  Dr. Steele Berg.   HISTORY OF PRESENT ILLNESS:  A 77 year old gentleman admitted to [facility] with CVA and found to have bradycardia. Mr. Vanderveen has no  significant past cardiac history. He is unable to provide information due to  aphasia. This patient's wife reports no prior episodes of dizziness nor  syncope. He has never been seen by cardiologist nor undergone any  significant cardiac testing.   During monitoring, heart rates have been detected by the monitor as low as  38. Only sinus bradycardia at a rate in the low 40s has been documented by a  rhythm strip.   Past medical history is essentially negative. The patient has not seen a  physician for more than 30 years. He takes no medications and has no known  allergies.   SOCIAL HISTORY:  Lives with his wife of 51 years in [place]; retired from  work as an Advertising account planner. No use of tobacco nor alcohol.   FAMILY HISTORY:  Sketchy; no prominent cardiac disease; father has had a  cerebrovascular accident.   REVIEW OF SYSTEMS:  Notable for 50 pounds weight gain over the past two  years, full dentures, chronic anxiety, arthralgias of the right knee and  rare constipation. Other systems reviewed and are negative.   EXAM:  GENERAL:  Aphasic relatively calm gentleman in no acute distress.  VITAL SIGNS:  Temperature is 97.6, heart rate 50 regular, respirations 20,  blood pressure 140/70, O2 saturation 99% on room air. Temperature 98.1.  HEENT:  Pupils equal, round, react to light; EOMs full; normal oral  mucosa.  NECK:  No jugular venous distension; normal carotid upstrokes; cannot  appreciate bruits, but the patient does not respond to requests to assist in  breathing.  LUNGS:  Grossly clear.  CARDIAC:  Normal first and second heart sounds; fourth heart sound present;  normal PMI.  ABDOMEN:  Soft and nontender; no masses; no organomegaly; normal bowel  sounds.  ENDOCRINE:  No thyromegaly.  HEMATOPOIETIC:  No adenopathy.  SKIN:  No significant lesions.  EXTREMITIES:  Trace edema; distal pulses intact.  NEUROMUSCULAR:  Aphasic; no gross abnormalities in motor function; cranial  nerves appear intact.   EKG: Sinus bradycardia; borderline first-degree AV block; frequent PVCs;  left anterior fascicular block; borderline IVCD; possible prior septal  myocardial infarction, low voltage; borderline criteria for LVH.   The other laboratory tests are unremarkable.   The carotid ultrasound studies reportedly demonstrate left ICA occlusion.   IMPRESSION:  Mr. Kepner has conduction system disease that appears to be  causing no symptoms. It is obviously more difficult to evaluate him in the  setting of an acute  cerebrovascular accident. He may have some intracranial  hypertension contributing to his bradycardia. The notion that his  bradycardia could be impairing cerebral hypoperfusion is speculative and not  amendable to standard and readily available tests. Accordingly, I would  recommend no intervention for his brady arrhythmia. Obviously, it is  important to avoid medications which might exacerbate this condition. We  will be happy to follow him subsequent to discharge in case his conduction  system disease worsens.   He does have EKG evidence for possible prior septal myocardial infarction.  An echocardiogram will be obtained. There is no reason to think that he has  had an acute event.   We appreciate the request for consultation and will be happy to follow this  gentleman with  you.      Weatherby Bing, [name] [facility]  Electronically Signed     RR/MEDQ  D:  12/24/2004  T:  12/25/2004  Job:  [id number]

## 2010-11-05 LAB — CBC
HCT: 41.5
Hemoglobin: 14.5
MCV: 99.8
Platelets: 125 — ABNORMAL LOW
WBC: 5.8

## 2010-11-05 LAB — I-STAT 8, (EC8 V) (CONVERTED LAB)
Acid-Base Excess: 2
Chloride: 106
HCT: 44
Operator id: 272551
Potassium: 4.3
Sodium: 138
TCO2: 28

## 2010-11-05 LAB — DIFFERENTIAL
Eosinophils Absolute: 0.2
Eosinophils Relative: 3
Lymphs Abs: 0.7
Monocytes Absolute: 0.8 — ABNORMAL HIGH
Monocytes Relative: 14 — ABNORMAL HIGH

## 2010-11-05 LAB — PROTIME-INR: Prothrombin Time: 27.1 — ABNORMAL HIGH

## 2010-11-05 LAB — URINALYSIS, ROUTINE W REFLEX MICROSCOPIC
Glucose, UA: NEGATIVE
Specific Gravity, Urine: 1.014
pH: 6.5

## 2010-11-05 LAB — POCT I-STAT CREATININE: Operator id: 272551

## 2012-01-05 IMAGING — CR DG CHEST 1V PORT
1 series · 1 of 1 positions shown · non-contrast
Comparison: 1 day prior

CLINICAL DATA: Coronary artery disease.  CABG.

PORTABLE CHEST - 1 VIEW

[AP]
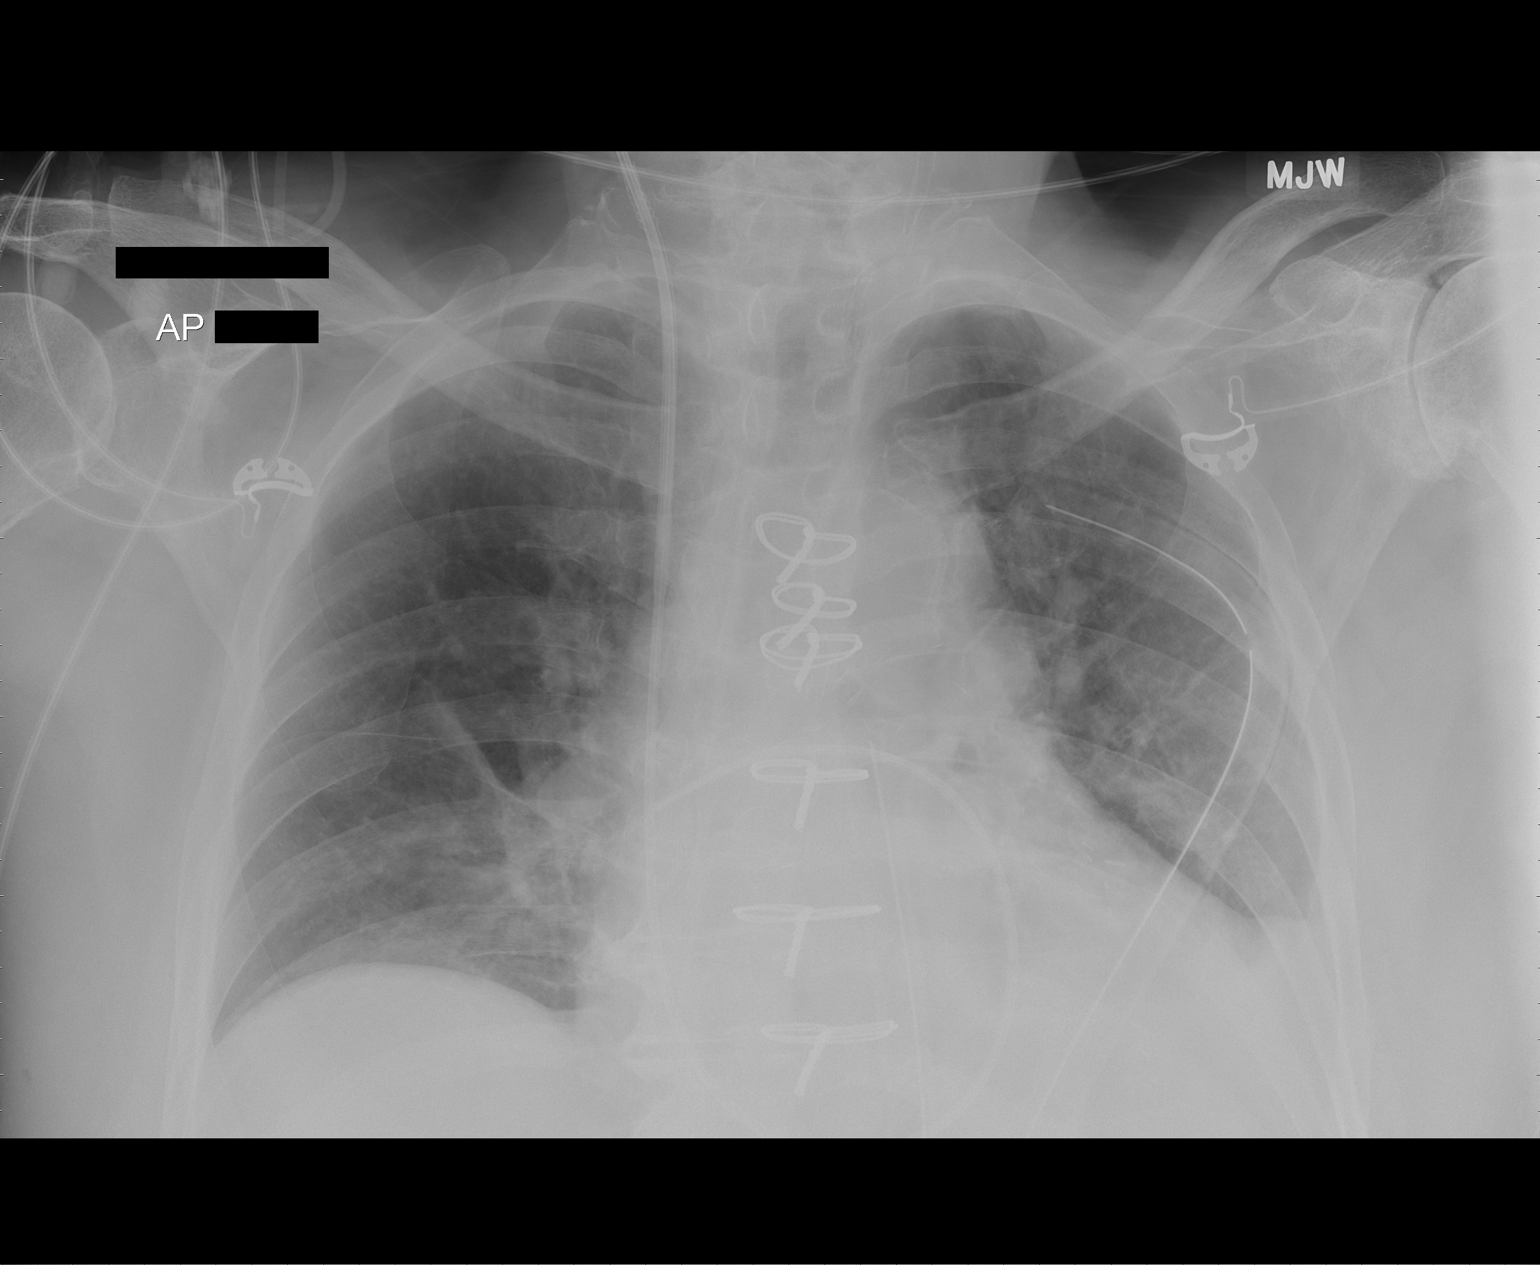

[1 of 1 positions shown; findings below may reference images not displayed]

FINDINGS: Right IJ Swan-Ganz catheter with tip at proximal right
pulmonary artery.  Interval extubation and removal of nasogastric
tube.  A mediastinal drain and left-sided chest tube remain in
place.

Mild cardiomegaly.  Small left-sided pleural effusion is slightly
increased. No pneumothorax.  Slightly decreased lung volumes with
increased patchy lower lobe predominant atelectasis.  More dense
left lower lobe airspace disease is slightly progressive.
IMPRESSION: 1.  Interval extubation with decreased lung volumes.
2.  Left-sided chest tube remains in place without pneumothorax.
3.  Slight increase in small left pleural effusion and adjacent
airspace disease, likely atelectasis.

## 2012-01-06 IMAGING — CR DG CHEST 2V
2 series · 2 of 2 positions shown · non-contrast
Comparison: 05/21/2009

CLINICAL DATA: Post CABG

CHEST - 2 VIEW

[w chest pa]
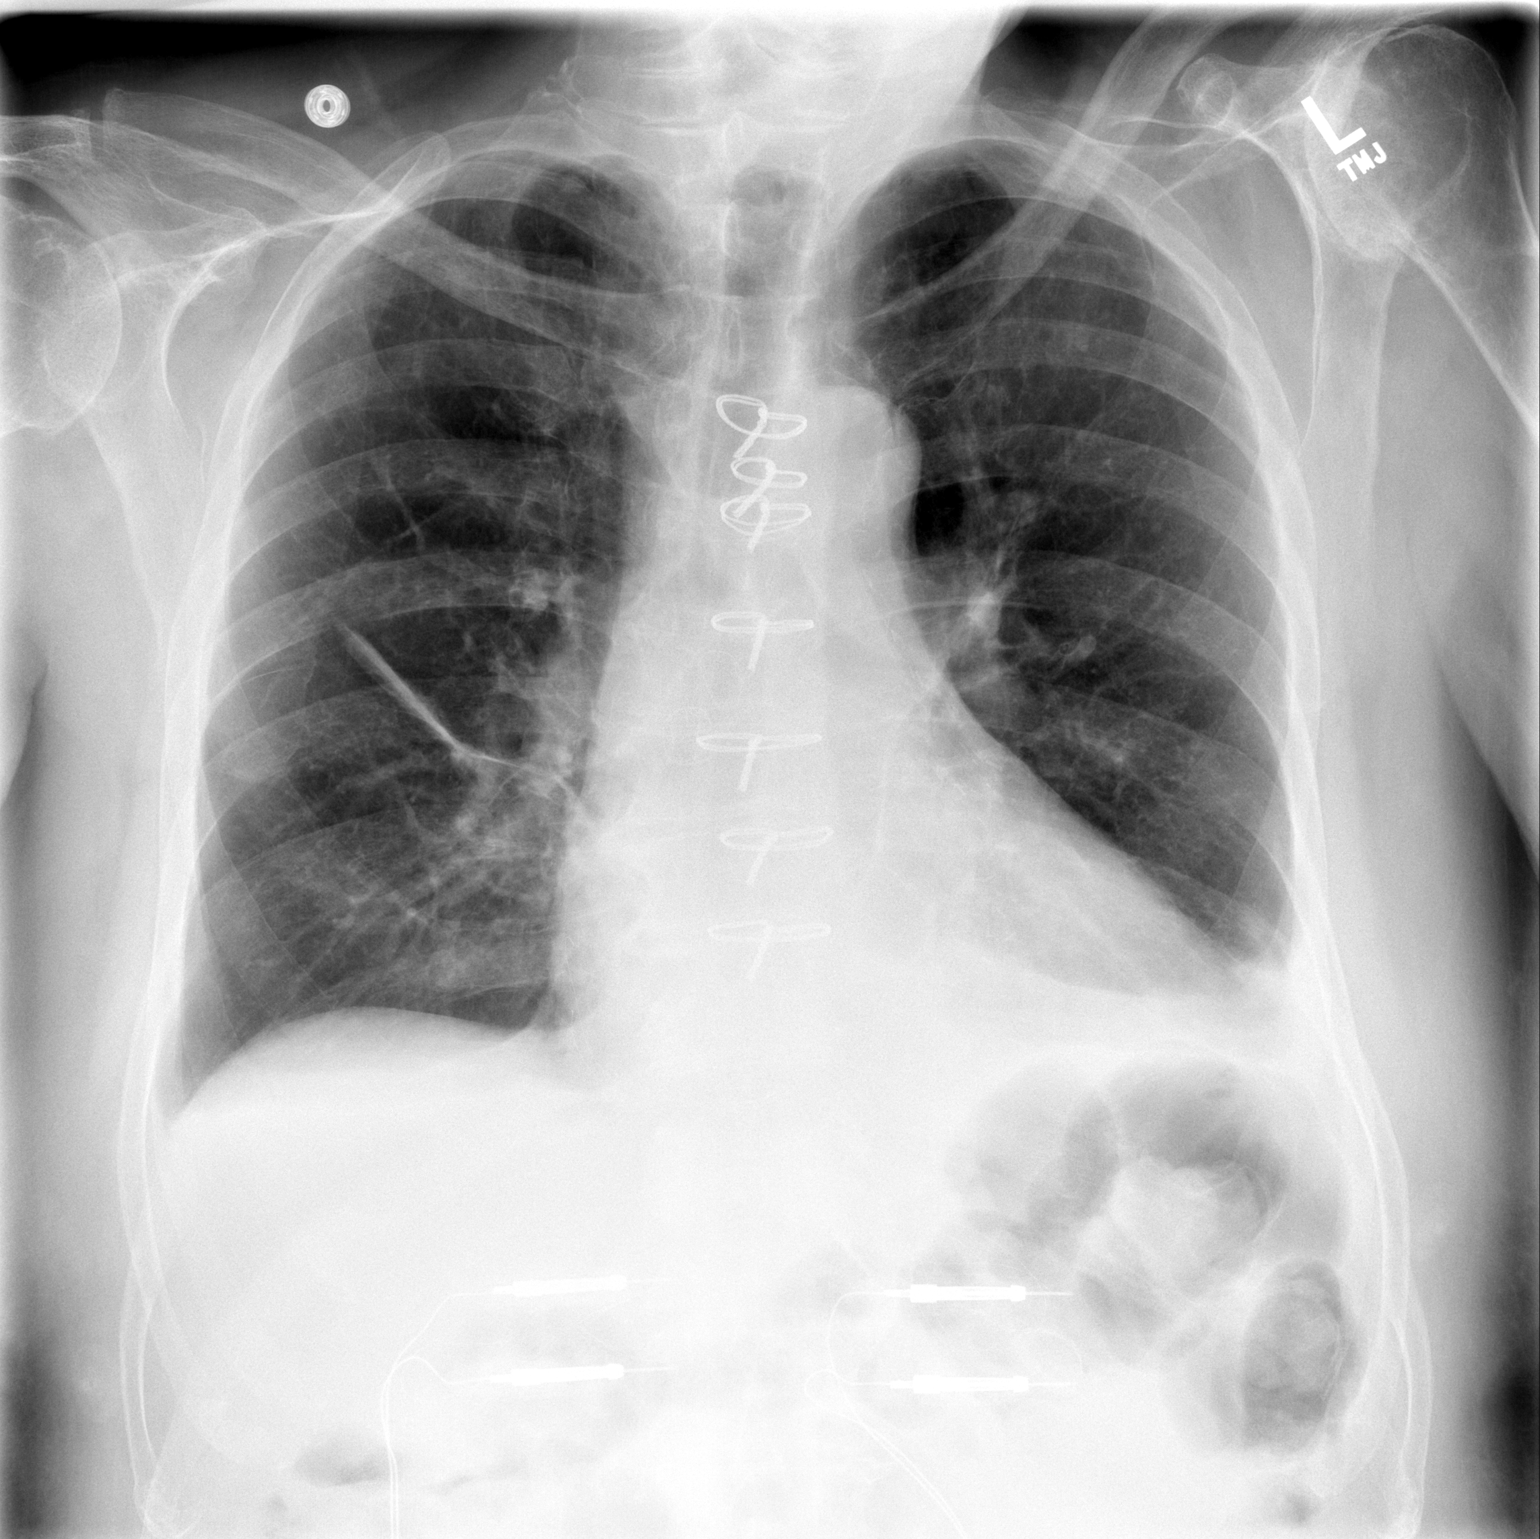

[w chest lat]
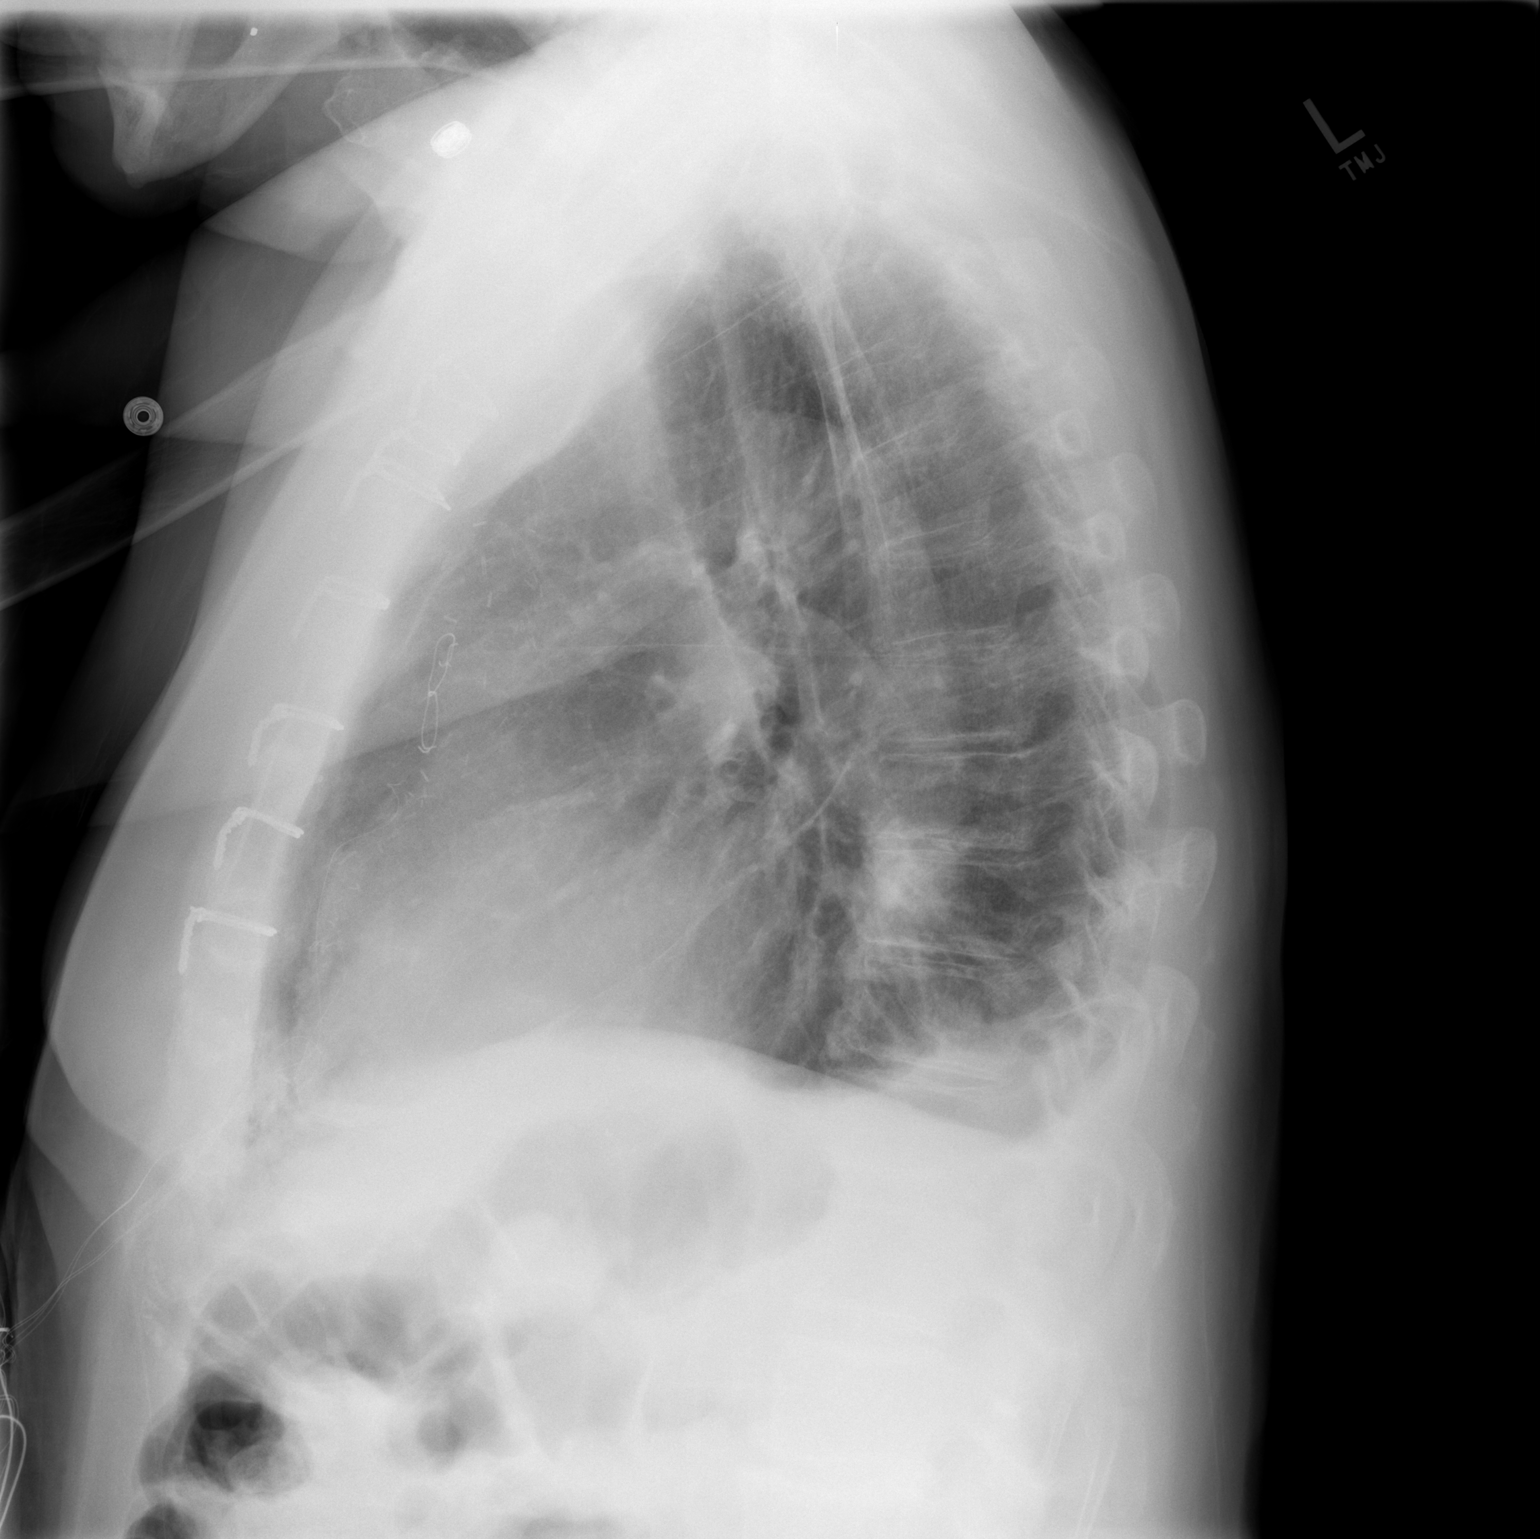

[2 of 2 positions shown; findings below may reference images not displayed]

FINDINGS: Tiger, Abimelk chest tube, and midline drains removed.
Epicardial pacer leads remain in place.

No pneumothorax.  There is a small amount of anterior mediastinal
air noted on the lateral view.  Improvement of lower lobe aeration
with still mild residual atelectasis and small pleural effusions.
IMPRESSION: 1.  Removal of all tubes and lines.  Epicardial pacer wires still
in place.
2.  Satisfactory postoperative chest x-ray with improved bibasilar
aeration.
3.  No new findings.

## 2012-01-25 DIAGNOSIS — Z87891 Personal history of nicotine dependence: Secondary | ICD-10-CM | POA: Insufficient documentation

## 2012-01-25 DIAGNOSIS — R5381 Other malaise: Secondary | ICD-10-CM | POA: Insufficient documentation

## 2012-01-25 DIAGNOSIS — E119 Type 2 diabetes mellitus without complications: Secondary | ICD-10-CM | POA: Insufficient documentation

## 2012-01-25 DIAGNOSIS — Z7901 Long term (current) use of anticoagulants: Secondary | ICD-10-CM | POA: Insufficient documentation

## 2012-01-25 NOTE — ED Notes (Signed)
Per EMS report: pt from home: Pt slid off the bed in an attempt to "crawl to the bathroom." Pt has a walker and pt normally shuffles around but he is unable to put weight on legs. Pt denies LOC or pain.  No injuries or deformities noted.EMS states that the living condition at the home is unclean and a potential hazard to the pt. Stroke screen was negative.  BP: 138/84 (Pt normally hypotensive, per pt), HR: 87, RR: 16, 97% RA, CBG: 129 Hx Afib, CHF, high cholesterol

## 2012-01-26 ENCOUNTER — Emergency Department (HOSPITAL_COMMUNITY)
Admission: EM | Admit: 2012-01-26 | Discharge: 2012-01-26 | Disposition: A | Discharge status: Home / Self Care | Payer: Medicare PPO | Source: Home / Self Care | Attending: Emergency Medicine | Admitting: Emergency Medicine

## 2012-01-26 ENCOUNTER — Encounter (HOSPITAL_COMMUNITY): Payer: Self-pay | Admitting: *Deleted

## 2012-01-26 ENCOUNTER — Emergency Department (HOSPITAL_COMMUNITY): Payer: Medicare PPO

## 2012-01-26 DIAGNOSIS — R531 Weakness: Secondary | ICD-10-CM

## 2012-01-26 LAB — URINE MICROSCOPIC-ADD ON

## 2012-01-26 LAB — URINALYSIS, ROUTINE W REFLEX MICROSCOPIC
Nitrite: NEGATIVE
Protein, ur: NEGATIVE mg/dL
Specific Gravity, Urine: 1.029 (ref 1.005–1.030)
Urobilinogen, UA: 1 mg/dL (ref 0.0–1.0)

## 2012-01-26 LAB — BASIC METABOLIC PANEL
CO2: 24 mEq/L (ref 19–32)
Chloride: 106 mEq/L (ref 96–112)
GFR calc Af Amer: >90 mL/min (ref >90)
Sodium: 141 mEq/L (ref 135–145)

## 2012-01-26 LAB — CBC
Platelets: 152 10*3/uL (ref 150–400)
RBC: 4.79 MIL/uL (ref 4.22–5.81)
RDW: 13.7 % (ref 11.5–15.5)
WBC: 7.6 10*3/uL (ref 4.0–10.5)

## 2012-01-26 LAB — GLUCOSE, CAPILLARY: Glucose-Capillary: 121 mg/dL — ABNORMAL HIGH (ref 70–99)

## 2012-01-26 LAB — CK TOTAL AND CKMB (NOT AT ARMC): Relative Index: INVALID (ref 0.0–2.5)

## 2012-01-26 MED ORDER — SODIUM CHLORIDE 0.9 % IV BOLUS (SEPSIS)
1000.0000 mL | Freq: Once | INTRAVENOUS | Status: AC
  Administered 2012-01-26: 1000 mL via INTRAVENOUS

## 2012-01-26 NOTE — ED Notes (Signed)
Initial triage BP of 137/44 was entered incorrectly.

## 2012-01-26 NOTE — ED Notes (Signed)
Pt can't stand for Orthostatic Vitals

## 2012-01-26 NOTE — ED Notes (Signed)
[id number]<AV> Expected date:<BR> Expected time:<BR> Means of arrival:<BR> Comments:<BR> EMS/elderly-found in bedroom-states trying to go to bathroom-slipped to floor and unable to get up-generalized weakness-no c/o pain-no LOC

## 2012-01-27 NOTE — ED Provider Notes (Signed)
History     CSN: [id number]  Arrival date & time 01/25/12  2351   First MD Initiated Contact with Patient 01/26/12 0214      No chief complaint on file.   (Consider location/radiation/quality/duration/timing/severity/associated sxs/prior treatment) HPI 79 year old gentleman presents to emergency room via EMS from home. Per daughter and wife patient's lift him up at around 4 AM yesterday morning, and they had difficulty getting out of bed. Daughter reports he had a normal day, but when they return to the house at 10 PM he was lying in the floor. They're unsure how long he had been lying there. Patient was attempting to crawl to the bathroom. Patient with some underlying mental disorder. Daughter cannot remember at the last time that he bathed. He frequently refuses medications. Patient is a quarter and lives in the bedroom and is daughter's home with his wife. The daughter reports the bedroom is in a "deplorable state". Patient uses a walker. Daughter reports he is normally fairly active going to [place] once a week, and shopping as needed.  No chest pain, no nausea vomiting diarrhea. Patient is a poor eater and drinker, but no different from his baseline. He has history of stroke in the past.   Past Medical History  Diagnosis Date  . Diabetes mellitus without complication     Past Surgical History  Procedure Date  . Cardiac surgery     History reviewed. No pertinent family history.  History  Substance Use Topics  . Smoking status: Former Games developer  . Smokeless tobacco: Not on file  . Alcohol Use: No      Review of Systems  See History of Present Illness; otherwise all other systems are reviewed and negative  Allergies  Review of patient's allergies indicates no known allergies.  Home Medications   Current Outpatient Rx  Name  Route  Sig  Dispense  Refill  . LIPITOR PO   Oral   Take 1 tablet by mouth every evening.          [name] COREG PO   Oral   Take 1 tablet by  mouth 2 (two) times daily.          . WARFARIN SODIUM 1 MG PO TABS   Oral   Take 1 mg by mouth as directed. Take with 4mg  tablet as directed. Total nightly dose is 5mg  on day 1, 5mg  on day 2, 4mg  on day 3. Repeat.         . WARFARIN SODIUM 4 MG PO TABS   Oral   Take 4 mg by mouth as directed. Take with 1mg  tablet as directed. Total nightly dose is 5mg  on day 1, 5mg  on day 2, 4mg  on day 3. Repeat           BP 132/87  Pulse 100  Temp 98.6 F (37 C) (Oral)  Resp 20  SpO2 97%  Physical Exam  Nursing note and vitals reviewed. Constitutional: He is oriented to person, place, and time. He appears well-developed and well-nourished.       Unkempt  HENT:  Head: Normocephalic and atraumatic.  Right Ear: External ear normal.  Left Ear: External ear normal.  Nose: Nose normal.  Mouth/Throat: Oropharynx is clear and moist.  Eyes: Conjunctivae normal and EOM are normal. Pupils are equal, round, and reactive to light.  Neck: Normal range of motion. Neck supple. No JVD present. No tracheal deviation present. No thyromegaly present.  Cardiovascular: Normal rate, normal heart sounds and intact distal pulses.  Exam reveals no gallop and no friction rub.   No murmur heard.      Occasionally irregular rate  Pulmonary/Chest: Effort normal and breath sounds normal. No stridor. No respiratory distress. He has no wheezes. He has no rales. He exhibits no tenderness.  Abdominal: Soft. Bowel sounds are normal. He exhibits no distension and no mass. There is no tenderness. There is no rebound and no guarding.  Musculoskeletal: Normal range of motion. He exhibits no edema and no tenderness.  Lymphadenopathy:    He has no cervical adenopathy.  Neurological: He is alert and oriented to person, place, and time. He has normal reflexes. No cranial nerve deficit. He exhibits normal muscle tone. Coordination normal.  Skin: Skin is warm and dry. No rash noted. No erythema. No pallor.  Psychiatric: He has a  normal mood and affect. His behavior is normal. Judgment and thought content normal.    ED Course  Procedures (including critical care time)  Labs Reviewed  BASIC METABOLIC PANEL - Abnormal; Notable for the following:    Glucose, Bld 129 (*)     GFR calc non Af Amer 81 (*)     All other components within normal limits  GLUCOSE, CAPILLARY - Abnormal; Notable for the following:    Glucose-Capillary 121 (*)     All other components within normal limits  URINALYSIS, ROUTINE W REFLEX MICROSCOPIC - Abnormal; Notable for the following:    Color, Urine AMBER (*)  BIOCHEMICALS MAY BE AFFECTED BY COLOR   Hgb urine dipstick MODERATE (*)     Bilirubin Urine SMALL (*)     Ketones, ur TRACE (*)     All other components within normal limits  CBC  URINE MICROSCOPIC-ADD ON  CK TOTAL AND CKMB  LAB REPORT - SCANNED   Ct Head Wo Contrast  01/26/2012  *RADIOLOGY REPORT*  Clinical Data: Generalized weakness.  CT HEAD WITHOUT CONTRAST  Technique:  Contiguous axial images were obtained from the base of the skull through the vertex without contrast.  Comparison: CT of the head performed 05/23/2009  Findings: There is no evidence of acute infarction, mass lesion, or intra- or extra-axial hemorrhage on CT.  Prominence of ventricles and sulci reflects mild cortical volume loss.  Diffuse periventricular white matter change likely reflects small vessel ischemic microangiopathy.  There is chronic infarct at the left frontoparietal region, with associated encephalomalacia. This involves the left basal ganglia.  Calcification is noted at the basal ganglia bilaterally.  The brainstem and fourth ventricle are within normal limits.  No mass effect or midline shift is seen.  There is no evidence of fracture; visualized osseous structures are unremarkable in appearance.  The orbits are within normal limits. The paranasal sinuses and mastoid air cells are well-aerated.  No significant soft tissue abnormalities are seen.   IMPRESSION:  1.  No acute intracranial pathology seen on CT. 2.  Mild cortical volume loss and diffuse small vessel ischemic microangiopathy noted. 3.  Chronic infarct at the left frontoparietal region, with associated encephalomalacia.   Original Report Authenticated By: Tonia Ghent, [name]     Date: 01/26/2012  Rate: 82  Rhythm: normal sinus rhythm and premature ventricular contractions (PVC)  QRS Axis: left  Intervals: normal  ST/T Wave abnormalities: nonspecific ST changes  Conduction Disutrbances:nonspecific intraventricular conduction delay  Narrative Interpretation: pvc new  Old EKG Reviewed: changes noted    1. Weakness       MDM  79 year old male with generalized weakness. Initially unable to stand for  orthostatics, but with walker was able to stay in a walk down the hallway. Family is comfortable with taking him home. Have encouraged them to increase his fluid intake. Will send a message to his primary care Dr. for  in-home physical therapy evaluation.        Olivia Mackie, MD 01/27/12 [phone]

## 2012-01-28 ENCOUNTER — Encounter (HOSPITAL_COMMUNITY): Payer: Self-pay

## 2012-01-28 ENCOUNTER — Emergency Department (HOSPITAL_COMMUNITY): Payer: Medicare PPO

## 2012-01-28 ENCOUNTER — Inpatient Hospital Stay (HOSPITAL_COMMUNITY)
Admission: EM | Admit: 2012-01-28 | Discharge: 2012-02-01 | DRG: 065 | Disposition: A | Discharge status: Skilled Nursing Facility | Payer: Medicare PPO | Attending: Internal Medicine | Admitting: Internal Medicine

## 2012-01-28 DIAGNOSIS — F0390 Unspecified dementia without behavioral disturbance: Secondary | ICD-10-CM | POA: Diagnosis present

## 2012-01-28 DIAGNOSIS — E119 Type 2 diabetes mellitus without complications: Secondary | ICD-10-CM | POA: Diagnosis present

## 2012-01-28 DIAGNOSIS — Z87891 Personal history of nicotine dependence: Secondary | ICD-10-CM

## 2012-01-28 DIAGNOSIS — Z91199 Patient's noncompliance with other medical treatment and regimen due to unspecified reason: Secondary | ICD-10-CM

## 2012-01-28 DIAGNOSIS — R531 Weakness: Secondary | ICD-10-CM | POA: Diagnosis present

## 2012-01-28 DIAGNOSIS — M109 Gout, unspecified: Secondary | ICD-10-CM | POA: Diagnosis present

## 2012-01-28 DIAGNOSIS — Z23 Encounter for immunization: Secondary | ICD-10-CM

## 2012-01-28 DIAGNOSIS — I6529 Occlusion and stenosis of unspecified carotid artery: Secondary | ICD-10-CM | POA: Diagnosis present

## 2012-01-28 DIAGNOSIS — Z9119 Patient's noncompliance with other medical treatment and regimen: Secondary | ICD-10-CM

## 2012-01-28 DIAGNOSIS — I4891 Unspecified atrial fibrillation: Secondary | ICD-10-CM | POA: Diagnosis present

## 2012-01-28 DIAGNOSIS — I255 Ischemic cardiomyopathy: Secondary | ICD-10-CM | POA: Diagnosis present

## 2012-01-28 DIAGNOSIS — Z79899 Other long term (current) drug therapy: Secondary | ICD-10-CM

## 2012-01-28 DIAGNOSIS — I10 Essential (primary) hypertension: Secondary | ICD-10-CM | POA: Diagnosis present

## 2012-01-28 DIAGNOSIS — Z950 Presence of cardiac pacemaker: Secondary | ICD-10-CM

## 2012-01-28 DIAGNOSIS — Z7901 Long term (current) use of anticoagulants: Secondary | ICD-10-CM

## 2012-01-28 DIAGNOSIS — I2589 Other forms of chronic ischemic heart disease: Secondary | ICD-10-CM | POA: Diagnosis present

## 2012-01-28 DIAGNOSIS — I6340 Cerebral infarction due to embolism of unspecified cerebral artery: Principal | ICD-10-CM | POA: Diagnosis present

## 2012-01-28 DIAGNOSIS — I509 Heart failure, unspecified: Secondary | ICD-10-CM | POA: Diagnosis present

## 2012-01-28 DIAGNOSIS — M129 Arthropathy, unspecified: Secondary | ICD-10-CM | POA: Diagnosis present

## 2012-01-28 DIAGNOSIS — M6281 Muscle weakness (generalized): Secondary | ICD-10-CM

## 2012-01-28 DIAGNOSIS — F05 Delirium due to known physiological condition: Secondary | ICD-10-CM

## 2012-01-28 DIAGNOSIS — Z8673 Personal history of transient ischemic attack (TIA), and cerebral infarction without residual deficits: Secondary | ICD-10-CM

## 2012-01-28 HISTORY — DX: Ischemic cardiomyopathy: I25.5

## 2012-01-28 HISTORY — DX: Cerebral infarction, unspecified: I63.9

## 2012-01-28 HISTORY — DX: Unspecified osteoarthritis, unspecified site: M19.90

## 2012-01-28 HISTORY — DX: Gout, unspecified: M10.9

## 2012-01-28 HISTORY — DX: Heart failure, unspecified: I50.9

## 2012-01-28 LAB — CBC WITH DIFFERENTIAL/PLATELET
Eosinophils Absolute: 0.2 10*3/uL (ref 0.0–0.7)
Hemoglobin: 15 g/dL (ref 13.0–17.0)
Lymphocytes Relative: 16 % (ref 12–46)
Lymphs Abs: 1 10*3/uL (ref 0.7–4.0)
MCH: 30.8 pg (ref 26.0–34.0)
Monocytes Relative: 11 % (ref 3–12)
Neutro Abs: 4.4 10*3/uL (ref 1.7–7.7)
Neutrophils Relative %: 70 % (ref 43–77)
Platelets: 143 10*3/uL — ABNORMAL LOW (ref 150–400)
RBC: 4.87 MIL/uL (ref 4.22–5.81)
WBC: 6.3 10*3/uL (ref 4.0–10.5)

## 2012-01-28 LAB — COMPREHENSIVE METABOLIC PANEL
ALT: 9 U/L (ref 0–53)
Alkaline Phosphatase: 69 U/L (ref 39–117)
BUN: 19 mg/dL (ref 6–23)
CO2: 25 mEq/L (ref 19–32)
Chloride: 105 mEq/L (ref 96–112)
GFR calc Af Amer: >90 mL/min (ref >90)
GFR calc non Af Amer: 81 mL/min — ABNORMAL LOW (ref >90)
Glucose, Bld: 104 mg/dL — ABNORMAL HIGH (ref 70–99)
Potassium: 4.7 mEq/L (ref 3.5–5.1)
Sodium: 142 mEq/L (ref 135–145)
Total Bilirubin: 1.2 mg/dL (ref 0.3–1.2)
Total Protein: 7.2 g/dL (ref 6.0–8.3)

## 2012-01-28 LAB — PROTIME-INR: Prothrombin Time: 14.2 seconds (ref 11.6–15.2)

## 2012-01-28 LAB — TROPONIN I: Troponin I: <0.3 ng/mL (ref <0.30)

## 2012-01-28 MED ORDER — ATORVASTATIN CALCIUM 10 MG PO TABS
10.0000 mg | ORAL_TABLET | Freq: Every day | ORAL | Status: DC
  Administered 2012-01-28 – 2012-02-01 (×5): 10 mg via ORAL
  Filled 2012-01-28 (×5): qty 1

## 2012-01-28 MED ORDER — WARFARIN SODIUM 7.5 MG PO TABS
7.5000 mg | ORAL_TABLET | Freq: Once | ORAL | Status: AC
  Administered 2012-01-28: 7.5 mg via ORAL
  Filled 2012-01-28 (×2): qty 1

## 2012-01-28 MED ORDER — WARFARIN SODIUM 5 MG PO TABS
5.0000 mg | ORAL_TABLET | Freq: Once | ORAL | Status: DC
  Filled 2012-01-28: qty 1

## 2012-01-28 MED ORDER — WARFARIN SODIUM 4 MG PO TABS: 4.0000 mg | ORAL_TABLET | ORAL | Status: DC

## 2012-01-28 MED ORDER — WARFARIN SODIUM 5 MG PO TABS: 5.0000 mg | ORAL_TABLET | ORAL | Status: DC

## 2012-01-28 MED ORDER — ASPIRIN 325 MG PO TABS
325.0000 mg | ORAL_TABLET | Freq: Every day | ORAL | Status: DC
  Administered 2012-01-28 – 2012-01-30 (×3): 325 mg via ORAL
  Filled 2012-01-28 (×4): qty 1

## 2012-01-28 MED ORDER — WARFARIN SODIUM 1 MG PO TABS: 1.0000 mg | ORAL_TABLET | ORAL | Status: DC

## 2012-01-28 MED ORDER — WARFARIN - PHYSICIAN DOSING INPATIENT: Freq: Every day | Status: DC

## 2012-01-28 MED ORDER — CARVEDILOL 12.5 MG PO TABS
12.5000 mg | ORAL_TABLET | Freq: Two times a day (BID) | ORAL | Status: DC
  Administered 2012-01-29 – 2012-02-01 (×8): 12.5 mg via ORAL
  Filled 2012-01-28 (×9): qty 1

## 2012-01-28 MED ORDER — WARFARIN SODIUM 4 MG PO TABS
4.0000 mg | ORAL_TABLET | ORAL | Status: DC
  Filled 2012-01-28: qty 1

## 2012-01-28 MED ORDER — SODIUM CHLORIDE 0.9 % IV SOLN
INTRAVENOUS | Status: AC
  Administered 2012-01-28: 20:00:00 via INTRAVENOUS

## 2012-01-28 MED ORDER — INSULIN ASPART 100 UNIT/ML ~~LOC~~ SOLN: 0.0000 [IU] | Freq: Three times a day (TID) | SUBCUTANEOUS | Status: DC

## 2012-01-28 NOTE — ED Notes (Signed)
i was unsuccessful with in and out cath.  Sterile technique was used during procedure.  During procedure i experienced resistance during insertion.  Patient showed signes of discomfort.  (facial grimis).  Discontinued procedure.  During removal of cath. End, there was a small spot of blood in tube.  Reported to whiney/rn.

## 2012-01-28 NOTE — ED Notes (Addendum)
Per ems-pt recently seen at [facility] for same complaint, was d/c. Pt went to [facility] today and MD wanted pt evaluated for CVA. Pt has BLE weakness, daughter had stated that left side was weaker than right. Pt MD stated pt was "listless" and has "a definite decline in his condition." Bp_94/72 HR-68 O2-95% on RA.

## 2012-01-28 NOTE — ED Notes (Signed)
Patient transported to CT 

## 2012-01-28 NOTE — Progress Notes (Signed)
Report given to RN Jodene Nam ,& pt.transferred to room 3033 with monitor & via bed.

## 2012-01-28 NOTE — Progress Notes (Signed)
Received pt.from ED via stretcher ,awake ,alert ,oriented x2,accompanied by daughter. Report received from am nurse LONNIE,pt was brought by family bec.of increasing weakness & confusion,after he was seen in the DR.office.Oriented pt.to the room  & placed on heart monitor.After checking admitting orders ,noted that  They r/o stroke & he needs to go to stroke unit.Charge nurse notified &  She called bed placement.

## 2012-01-28 NOTE — H&P (Signed)
[facility] History and Physical  Jorge Klein WUJ:[id number] DOB: 1927-02-24 DOA: 01/28/2012  Referring physician: Gary Klein, ER resident PCP: Jorge Otto, MD  Specialists: Jorge Klein, neuro hospitalist  Chief Complaint: weakness, concerns for stroke  HPI: Jorge Klein is a 79 y.o. male  Past history of previous CVA on Coumadin and pacemaker (suspected atrial fibrillation) as well as chronic dementia who lives at home with wife and children. Reports of medical noncompliance. Patient apparently in the last week has had a significant decline in ability to ambulate as well as get up out of bed requiring maximal assistance. Patient was brought into the emergency room on 12/31 and was evaluated. At that time CT scan of head was negative. Lab work was negative. Patient was sent home after with hydration, he was able to tolerate standing up and using a walker. Today he saw his PCP's partner and was noted to have left-sided weakness on exam. His weakness has since persisted from going to the emergency room and so his primary care physician's partner was concerned about CVA. Patient was sent over to the emergency room. Unable to get MRI because of pacemaker. In discussion with neurology, they did not recommend CT. INR was noted to be subtherapeutic today at 1.16. Family says the patient is supposed to be taking his Coumadin. Hospitalist called for further evaluation and admission.  Review of Systems: if you have to give a review of systems from the patient given his dementia. He denies any complaints. No vision problems, chest pain, shortness of breath no abdominal pain or weakness.  Past Medical History  Diagnosis Date  . Diabetes mellitus without complication   . CHF (congestive heart failure)   . Stroke   . Gout   . Arthritis   . Ischemic cardiomyopathy    Past Surgical History  Procedure Date  . Cardiac surgery    Social History:  reports that he has quit smoking. He  does not have any smokeless tobacco history on file. He reports that he does not drink alcohol or use illicit drugs. Patient lives at home with his wife in their kids home. He is able to ambulate normally with using a walker requiring assistance to get out of bed normally. He also reports of medicine noncompliance  No Known Allergies  Family history: Family reports hypertension runs in the family  Prior to Admission medications   Medication Sig Start Date End Date Taking? Authorizing Provider  atorvastatin (LIPITOR) 10 MG tablet Take 10 mg by mouth daily.    Historical Provider, MD  carvedilol (COREG) 12.5 MG tablet Take 12.5 mg by mouth 2 (two) times daily with a meal.    Historical Provider, MD  warfarin (COUMADIN) 1 MG tablet Take 1 mg by mouth as directed. Take with 4mg  tablet as directed. Total nightly dose is 5mg  on day 1, 5mg  on day 2, 4mg  on day 3. Repeat.    Historical Provider, MD  warfarin (COUMADIN) 4 MG tablet Take 4 mg by mouth as directed. Take with 1mg  tablet as directed. Total nightly dose is 5mg  on day 1, 5mg  on day 2, 4mg  on day 3. Repeat    Historical Provider, MD   Physical Exam: Filed Vitals:   01/28/12 1610 01/28/12 1742  BP: 122/76 141/80  Pulse: 86 79  Temp: 97.9 F (36.6 C)   TempSrc: Oral   Resp: 20 22  SpO2: 97% 98%     General:  Pleasantly confused. Oriented x2. Does not appear to be  any acute distress.  Eyes: cranial nerves II through XII appear to be intact. Extraocular movements are intact  ENT: normocephalic, atraumatic, mucous membranes are slightly dry  Neck: supple, no JVD  Cardiovascular: irregular rhythm, rate controlled  Respiratory: clear auscultation bilaterally  Abdomen: soft, nontender, nondistended, positive bowel sounds  Skin: no acute skin breaks, tears or lesions. Some superficial bruising  Musculoskeletal: no clubbing or cyanosis, 1+ pitting edema from the knees down  Psychiatric: chronic dementia  Neurologic: patient does  have some focal deficits with some mild weakness on the left side. He is about a 4+ to 5 minus on the left upper and lower extremity as compared to the right which is 5/5  Labs on Admission:  Basic Metabolic Panel:  Lab 01/28/12 1610 01/26/12 0110  NA 142 141  K 4.7 4.3  CL 105 106  CO2 25 24  GLUCOSE 104* 129*  BUN 19 19  CREATININE 0.79 0.79  CALCIUM 9.7 9.3  MG -- --  PHOS -- --   Liver Function Tests:  Lab 01/28/12 1636  AST 23  ALT 9  ALKPHOS 69  BILITOT 1.2  PROT 7.2  ALBUMIN 4.1   CBC:  Lab 01/28/12 1636 01/26/12 0110  WBC 6.3 7.6  NEUTROABS 4.4 --  HGB 15.0 15.4  HCT 46.6 45.2  MCV 95.7 94.4  PLT 143* 152   Cardiac Enzymes:  Lab 01/28/12 1630 01/26/12 0615  CKTOTAL -- 79  CKMB -- 3.9  CKMBINDEX -- --  TROPONINI <0.30 --     Lab 01/26/12 0122  GLUCAP 121*    Radiological Exams on Admission: Dg Chest 2 View  01/28/2012   IMPRESSION: Cardiomegaly with mild vascular congestion.  No acute cardiopulmonary process.   Original Report Authenticated By: Kennith Center, [name]    CT scan of the head:done 12/31:1. No acute intracranial pathology seen on CT.  2. Mild cortical volume loss and diffuse small vessel ischemic  microangiopathy noted.  3. Chronic infarct at the left frontoparietal region, with  associated encephalomalacia.   EKG: Independently reviewed. Atrial fibrillation currently rate controlled  Assessment/Plan Principal Problem:  *Weakness of left side of body: Suspicious for CVA. CT scan negative done on 12/31, however stroke may be too small or 2 new. Unable to get MRI because a pacemaker. Discussed with neurology and they feel no new CT as necessary. They recommend doing a stroke workup regardless. This is likely from embolic CVA brought on by noncompliance with Coumadin Active Problems:  Dementia, senile, with, delirium: Monitor for sundowning  HTN (hypertension): Continue home meds  History of CVA (cerebrovascular accident): Noted. See  above  Pacemaker: Stable. Unable to get MRI  A-fib: Currently rate controlled     Code Status: full code. This was conveyed by the patient to his daughter prior to onset of his dementia Family Communication: discussed plan with patient's wife and daughter at the bedside Disposition Plan: likely here for several days, may need new living arrangement  Time spent: 30 minutes  Hollice Espy [facility] Pager [phone]  If 7PM-7AM, please contact night-coverage [email or web address] Password [facility] 01/28/2012, 7:09 PM

## 2012-01-28 NOTE — Progress Notes (Signed)
ANTICOAGULATION CONSULT NOTE - Initial Consult  Pharmacy Consult for Coumadin Indication: stroke  No Known Allergies  Patient Measurements:   Heparin Dosing Weight:   Vital Signs: Temp: 97.9 F (36.6 C) (01/02 1610) Temp src: Oral (01/02 1610) BP: 141/80 mmHg (01/02 1742) Pulse Rate: 79  (01/02 1742)  Labs:  Basename 01/28/12 1636 01/28/12 1630 01/26/12 0615 01/26/12 0110  HGB 15.0 -- -- 15.4  HCT 46.6 -- -- 45.2  PLT 143* -- -- 152  APTT -- -- -- --  LABPROT 14.2 -- -- --  INR 1.11 -- -- --  HEPARINUNFRC -- -- -- --  CREATININE 0.79 -- -- 0.79  CKTOTAL -- -- 79 --  CKMB -- -- 3.9 --  TROPONINI -- <0.30 -- --    CrCl is unknown because there is no height on file for the current visit.   Medical History: Past Medical History  Diagnosis Date  . Diabetes mellitus without complication   . CHF (congestive heart failure)   . Stroke   . Gout   . Arthritis   . Ischemic cardiomyopathy     Medications:  Scheduled:    . sodium chloride   Intravenous STAT  . aspirin  325 mg Oral Daily  . atorvastatin  10 mg Oral q1800  . carvedilol  12.5 mg Oral BID WC  . insulin aspart  0-9 Units Subcutaneous TID WC  . warfarin  4 mg Oral Q72H  . warfarin  5 mg Oral Q72H  . warfarin  5 mg Oral Q72H  . warfarin  7.5 mg Oral Once  . Warfarin - Physician Dosing Inpatient   Does not apply q1800  . [DISCONTINUED] warfarin  1 mg Oral UD  . [DISCONTINUED] warfarin  4 mg Oral UD  . [DISCONTINUED] warfarin  5 mg Oral Q72H  . [DISCONTINUED] warfarin  5 mg Oral Once    Assessment: 79 yr old male with a previous history of CVA and on coumadin, was brought to the ED with left sided weakness. His INR was subtherapeutic at 1.11. His home dose of coumadin is 5mg , 5mg , 4 mg, and repeat. He apparently has not been taking all his doses. Pt has chronic dementia. He was unable to have an MRI because he has a pacemaker.  Goal of Therapy:  INR 2-3 Monitor platelets by anticoagulation protocol:  Yes   Plan:  Pt's regular dose of coumadin is 5 mg, 5 mg, 4 mg and repeat. His INR is subtherapeutic at 1.11. Tonight will give 7.5 mg and order daily INR with AM labs.  Eugene Garnet 01/28/2012,8:24 PM

## 2012-01-28 NOTE — Consult Note (Signed)
Referring Physician: Dr. Silverio Lay    Chief Complaint: left sided weakness  HPI: Jorge Klein is an 79 y.o. male who was LKW 01/24/2012 evening prior to going to bed around 2100. He was found beside the bed around 0200 on 01/25/2012, his family thought that he slipped out of bed. Some confusion, although he does have some confusion at baseline. The daughter went to work later that day and returned around 2200 same day. He was sitting in the floor and he was brought to [facility] ER. He was discharged back to home via EMS per daughter after he was able to ambulate to the door of his ER room. He did have left sided weakness and his confusion was worse. He has continued to have these problems and felt that they have worsened and took him to see Dr. Kirby Funk today and he was directed to the ER for admission today.  Patient has PPM. CANNOT HAVE MRI. He is on coumadin. Daughter states that he is on it for previous stroke "years ago". She was unaware of PPM. H/o CABG. No one is aware of valve replacement. INR subtherapeutic.   Date last known well: 01/24/2012 Time last known well: 2100 on 01/24/2012  tPA Given: No: out of window  Past Medical History  Diagnosis Date  . Diabetes mellitus without complication   . CHF (congestive heart failure)   . Stroke   . Gout   . Arthritis   . Ischemic cardiomyopathy     Past Surgical History  Procedure Date  . Cardiac surgery    Family history: dad-history of heart, stroke age 63  Social History:  reports that he has quit smoking. He does not have any smokeless tobacco history on file. He reports that he does not drink alcohol or use illicit drugs.  Allergies: No Known Allergies  Current Facility-Administered Medications  Medication Dose Route Frequency Provider Last Rate Last Dose  . atorvastatin (LIPITOR) tablet 10 mg  10 mg Oral Daily Hollice Espy, MD      . carvedilol (COREG) tablet 12.5 mg  12.5 mg Oral BID WC Hollice Espy, MD      .  warfarin (COUMADIN) tablet 1 mg  1 mg Oral UD Hollice Espy, MD      . warfarin (COUMADIN) tablet 4 mg  4 mg Oral UD Hollice Espy, MD       Current Outpatient Prescriptions  Medication Sig Dispense Refill  . atorvastatin (LIPITOR) 10 MG tablet Take 10 mg by mouth daily.      . carvedilol (COREG) 12.5 MG tablet Take 12.5 mg by mouth 2 (two) times daily with a meal.      . warfarin (COUMADIN) 1 MG tablet Take 1 mg by mouth as directed. Take with 4mg  tablet as directed. Total nightly dose is 5mg  on day 1, 5mg  on day 2, 4mg  on day 3. Repeat.      . warfarin (COUMADIN) 4 MG tablet Take 4 mg by mouth as directed. Take with 1mg  tablet as directed. Total nightly dose is 5mg  on day 1, 5mg  on day 2, 4mg  on day 3. Repeat        ROS: History obtained from child and unobtainable from patient due to mental status  General ROS: negative for - chills, fatigue, fever, night sweats, weight gain or weight loss Psychological ROS: negative for - behavioral disorder, hallucinations,  mood swings or suicidal ideation, ++memory difficulties Ophthalmic ROS: negative for - blurry vision, double vision,  eye pain or loss of vision ENT ROS: negative for - epistaxis, nasal discharge, oral lesions, sore throat, tinnitus or vertigo Allergy and Immunology ROS: negative for - hives or itchy/watery eyes Hematological and Lymphatic ROS: negative for - bleeding problems, bruising or swollen lymph nodes Endocrine ROS: negative for - galactorrhea, hair pattern changes, polydipsia/polyuria or temperature intolerance Respiratory ROS: negative for - cough, hemoptysis, shortness of breath or wheezing Cardiovascular ROS: negative for - chest pain, dyspnea on exertion, edema or irregular heartbeat Gastrointestinal ROS: negative for - abdominal pain, diarrhea, hematemesis, nausea/vomiting or stool incontinence Genito-Urinary ROS: negative for - dysuria, hematuria, incontinence or urinary frequency/urgency Musculoskeletal ROS:  negative for - joint swelling or muscular weakness, ++left sided weakness Neurological ROS: as noted in HPI Dermatological ROS: negative for rash and skin lesion changes    Physical Examination: Blood pressure 141/80, pulse 79, temperature 97.9 F (36.6 C), temperature source Oral, resp. rate 22, SpO2 98.00%.  Neurologic Examination: Mental Status: Alert.  Speech dysarthric. Able to follow 3 step commands with only some perseveration. Cranial Nerves: II: Discs flat bilaterally; Visual fields grossly normal, pupils 3 mm on the left and irregular, 2mm on the right and round.  Both reactive to light and accommodation III,IV, VI: ptosis not present, extra-ocular motions intact bilaterally V,VII: decrease in left NLF, facial light touch sensation normal bilaterally VIII: hearing normal bilaterally IX,X: gag reflex present XI: bilateral shoulder shrug XII: midline tongue extension Motor: Right : Upper extremity   5/5    Left:     Upper extremity   4-/5  Lower extremity   5/5     Lower extremity   4-/5 Tone and bulk:normal tone throughout; no atrophy noted Sensory: Pinprick and light touch intact throughout, bilaterally Deep Tendon Reflexes: 2+ and symmetric in the upper extremities, absent in the lower extremities Plantars: Right: mute   Left: mute Cerebellar: normal finger-to-nose and normal heel-to-shin test Gait: not tested CV: pulses palpable throughout   Results for orders placed during the hospital encounter of 01/28/12 (from the past 48 hour(s))  TROPONIN I     Status: Normal   Collection Time   01/28/12  4:30 PM      Component Value Range Comment   Troponin I <0.30  <0.30 ng/mL   PROTIME-INR     Status: Normal   Collection Time   01/28/12  4:36 PM      Component Value Range Comment   Prothrombin Time 14.2  11.6 - 15.2 seconds    INR 1.11  0.00 - 1.49   CBC WITH DIFFERENTIAL     Status: Abnormal   Collection Time   01/28/12  4:36 PM      Component Value Range Comment   WBC  6.3  4.0 - 10.5 K/uL    RBC 4.87  4.22 - 5.81 MIL/uL    Hemoglobin 15.0  13.0 - 17.0 g/dL    HCT 78.2  95.6 - 21.3 %    MCV 95.7  78.0 - 100.0 fL    MCH 30.8  26.0 - 34.0 pg    MCHC 32.2  30.0 - 36.0 g/dL    RDW 08.6  57.8 - 46.9 %    Platelets 143 (*) 150 - 400 K/uL    Neutrophils Relative 70  43 - 77 %    Neutro Abs 4.4  1.7 - 7.7 K/uL    Lymphocytes Relative 16  12 - 46 %    Lymphs Abs 1.0  0.7 - 4.0 K/uL  Monocytes Relative 11  3 - 12 %    Monocytes Absolute 0.7  0.1 - 1.0 K/uL    Eosinophils Relative 3  0 - 5 %    Eosinophils Absolute 0.2  0.0 - 0.7 K/uL    Basophils Relative 0  0 - 1 %    Basophils Absolute 0.0  0.0 - 0.1 K/uL   COMPREHENSIVE METABOLIC PANEL     Status: Abnormal   Collection Time   01/28/12  4:36 PM      Component Value Range Comment   Sodium 142  135 - 145 mEq/L    Potassium 4.7  3.5 - 5.1 mEq/L HEMOLYSIS AT THIS LEVEL MAY AFFECT RESULT   Chloride 105  96 - 112 mEq/L    CO2 25  19 - 32 mEq/L    Glucose, Bld 104 (*) 70 - 99 mg/dL    BUN 19  6 - 23 mg/dL    Creatinine, Ser 1.61  0.50 - 1.35 mg/dL    Calcium 9.7  8.4 - 09.6 mg/dL    Total Protein 7.2  6.0 - 8.3 g/dL    Albumin 4.1  3.5 - 5.2 g/dL    AST 23  0 - 37 U/L    ALT 9  0 - 53 U/L    Alkaline Phosphatase 69  39 - 117 U/L    Total Bilirubin 1.2  0.3 - 1.2 mg/dL    GFR calc non Af Amer 81 (*) >90 mL/min    GFR calc Af Amer >90  >90 mL/min    Dg Chest 2 View  01/28/2012  *RADIOLOGY REPORT*  Clinical Data: Weakness.Diabetes.  CHF.  CHEST - 2 VIEW  Comparison: 09/24/2009.  Findings: The lungs are clear without focal infiltrate, edema, pneumothorax or pleural effusion. The cardiopericardial silhouette is enlarged.Mild vascular congestion noted.  The patient is status post median sternotomy.  Left-sided pacer / AICD remains in place. Bones are diffusely demineralized.  IMPRESSION: Cardiomegaly with mild vascular congestion.  No acute cardiopulmonary process.   Original Report Authenticated By: Kennith Center, [name]     CT 01/26/2012 No acute intracranial pathology seen on CT. Mild cortical volume loss and diffuse small vessel ischemic  microangiopathy noted. Chronic infarct at the left frontoparietal region, with associated encephalomalacia.  Job Founds, MBA, MHA [facility] Pager [phone]   Patient seen and examined.  Clinical course and management discussed.  Necessary edits performed.  I agree with the above.  Assessment and plan of care developed and discussed below.    Assessment/Plan: 79 year old male with left sided weakness.  No sensory findings on exam.  Has had initial evaluation with a head CT on 12/31/ that showed no acute changes.  Symptoms have persisted.  Patient with stroke risk factors to include cardiomyopathy, CAD, DM and stroke in the past.  Patient on Coumadin but subtherapeutic.  Further work up indicated.  1. HgbA1c, fasting lipid panel 2. Repeat head CT without contrast 3. PT consult, OT consult, Speech consult 4. Echocardiogram 5. Carotid dopplers 6. Prophylactic therapy-Continue Coumadin with a target INR of 2-3.  No bridging therapy required.   7. Risk factor modification 8. Telemetry monitoring 9. Frequent neuro checks  Case discussed with Dr. Rosemary Holms, MD [facility] [phone]  01/28/2012  7:21 PM

## 2012-01-29 ENCOUNTER — Observation Stay (HOSPITAL_COMMUNITY): Payer: Medicare PPO

## 2012-01-29 DIAGNOSIS — I6789 Other cerebrovascular disease: Secondary | ICD-10-CM

## 2012-01-29 DIAGNOSIS — E119 Type 2 diabetes mellitus without complications: Secondary | ICD-10-CM

## 2012-01-29 DIAGNOSIS — R5381 Other malaise: Secondary | ICD-10-CM

## 2012-01-29 DIAGNOSIS — I6340 Cerebral infarction due to embolism of unspecified cerebral artery: Principal | ICD-10-CM

## 2012-01-29 DIAGNOSIS — I255 Ischemic cardiomyopathy: Secondary | ICD-10-CM | POA: Diagnosis present

## 2012-01-29 DIAGNOSIS — M109 Gout, unspecified: Secondary | ICD-10-CM | POA: Diagnosis present

## 2012-01-29 LAB — HEMOGLOBIN A1C
Hgb A1c MFr Bld: 5.6 % (ref <5.7)
Mean Plasma Glucose: 114 mg/dL (ref <117)

## 2012-01-29 LAB — GLUCOSE, CAPILLARY
Glucose-Capillary: 94 mg/dL (ref 70–99)
Glucose-Capillary: 104 mg/dL — ABNORMAL HIGH (ref 70–99)
Glucose-Capillary: 109 mg/dL — ABNORMAL HIGH (ref 70–99)

## 2012-01-29 LAB — PROTIME-INR: Prothrombin Time: 15.8 seconds — ABNORMAL HIGH (ref 11.6–15.2)

## 2012-01-29 MED ORDER — INFLUENZA VIRUS VACC SPLIT PF IM SUSP
0.5000 mL | INTRAMUSCULAR | Status: AC
  Administered 2012-01-30: 0.5 mL via INTRAMUSCULAR
  Filled 2012-01-29: qty 0.5

## 2012-01-29 MED ORDER — STROKE: EARLY STAGES OF RECOVERY BOOK
Freq: Once | Status: AC
  Administered 2012-01-30: 01:00:00
  Filled 2012-01-29: qty 1

## 2012-01-29 MED ORDER — WARFARIN - PHARMACIST DOSING INPATIENT: Freq: Every day | Status: DC

## 2012-01-29 MED ORDER — LORAZEPAM 2 MG/ML IJ SOLN
0.5000 mg | Freq: Four times a day (QID) | INTRAMUSCULAR | Status: DC | PRN
  Administered 2012-01-30 – 2012-02-01 (×3): 0.5 mg via INTRAVENOUS
  Filled 2012-01-29 (×3): qty 1

## 2012-01-29 MED ORDER — WARFARIN SODIUM 7.5 MG PO TABS
7.5000 mg | ORAL_TABLET | Freq: Once | ORAL | Status: AC
  Administered 2012-01-29: 7.5 mg via ORAL
  Filled 2012-01-29: qty 1

## 2012-01-29 NOTE — ED Provider Notes (Signed)
History     CSN: [id number]  Arrival date & time 01/28/12  1606   First MD Initiated Contact with Patient 01/28/12 1612      Chief Complaint  Patient presents with  . Weakness    (Consider location/radiation/quality/duration/timing/severity/associated sxs/prior treatment) Patient is a 79 y.o. male presenting with weakness and general illness. The history is provided by the patient and a relative. The history is limited by the condition of the patient.  Weakness Primary symptoms do not include headaches, dizziness, fever, nausea or vomiting.  Additional symptoms include weakness.  Illness  The current episode started 3 to 5 days ago. The onset is undetermined. The problem occurs continuously. The problem has been unchanged. The problem is moderate. Nothing relieves the symptoms. Nothing aggravates the symptoms. Pertinent negatives include no fever, no abdominal pain, no constipation, no diarrhea, no nausea, no vomiting, no congestion, no headaches, no sore throat, no cough and no rash.    Past Medical History  Diagnosis Date  . Diabetes mellitus without complication   . CHF (congestive heart failure)   . Stroke   . Gout   . Arthritis   . Ischemic cardiomyopathy     Past Surgical History  Procedure Date  . Cardiac surgery     No family history on file.  History  Substance Use Topics  . Smoking status: Former Games developer  . Smokeless tobacco: Not on file  . Alcohol Use: No      Review of Systems  Constitutional: Negative for fever and chills.  HENT: Negative for congestion and sore throat.   Respiratory: Negative for cough and shortness of breath.   Cardiovascular: Negative for chest pain and leg swelling.  Gastrointestinal: Negative for nausea, vomiting, abdominal pain, diarrhea and constipation.  Genitourinary: Negative for dysuria and frequency.  Skin: Negative for color change and rash.  Neurological: Positive for facial asymmetry, speech difficulty and weakness.  Negative for dizziness and headaches.  Psychiatric/Behavioral: Positive for confusion. Negative for agitation.  All other systems reviewed and are negative.    Allergies  Review of patient's allergies indicates no known allergies.  Home Medications  No current outpatient prescriptions on file.  BP 137/90  Pulse 84  Temp 97.5 F (36.4 C) (Oral)  Resp 20  Ht 6\' 3"  (1.905 m)  Wt 246 lb 7.6 oz (111.8 kg)  BMI 30.81 kg/m2  SpO2 95%  Physical Exam  Constitutional: He appears well-developed and well-nourished. No distress.  HENT:  Head: Normocephalic and atraumatic.  Eyes: EOM are normal. Pupils are equal, round, and reactive to light.  Neck: Normal range of motion. Neck supple.  Cardiovascular: Normal rate and regular rhythm.   Pulmonary/Chest: Effort normal. No respiratory distress.  Abdominal: Soft. He exhibits no distension.  Musculoskeletal: Normal range of motion. He exhibits no edema.  Neurological: He is alert. He has normal strength. A cranial nerve deficit (left sided facial droop) is present. No sensory deficit. He exhibits abnormal muscle tone (globally 4/5 in all extremities, mild increased weakness on left). Coordination (intentional finger/nose bilaterally) abnormal. GCS eye subscore is 4. GCS verbal subscore is 4. GCS motor subscore is 6.  Skin: Skin is warm and dry.  Psychiatric: He has a normal mood and affect. His behavior is normal.    ED Course  Procedures (including critical care time)  Results for orders placed during the hospital encounter of 01/28/12  PROTIME-INR      Component Value Range   Prothrombin Time 14.2  11.6 - 15.2 seconds  INR 1.11  0.00 - 1.49  CBC WITH DIFFERENTIAL      Component Value Range   WBC 6.3  4.0 - 10.5 K/uL   RBC 4.87  4.22 - 5.81 MIL/uL   Hemoglobin 15.0  13.0 - 17.0 g/dL   HCT 16.1  09.6 - 04.5 %   MCV 95.7  78.0 - 100.0 fL   MCH 30.8  26.0 - 34.0 pg   MCHC 32.2  30.0 - 36.0 g/dL   RDW 40.9  81.1 - 91.4 %   Platelets  143 (*) 150 - 400 K/uL   Neutrophils Relative 70  43 - 77 %   Neutro Abs 4.4  1.7 - 7.7 K/uL   Lymphocytes Relative 16  12 - 46 %   Lymphs Abs 1.0  0.7 - 4.0 K/uL   Monocytes Relative 11  3 - 12 %   Monocytes Absolute 0.7  0.1 - 1.0 K/uL   Eosinophils Relative 3  0 - 5 %   Eosinophils Absolute 0.2  0.0 - 0.7 K/uL   Basophils Relative 0  0 - 1 %   Basophils Absolute 0.0  0.0 - 0.1 K/uL  COMPREHENSIVE METABOLIC PANEL      Component Value Range   Sodium 142  135 - 145 mEq/L   Potassium 4.7  3.5 - 5.1 mEq/L   Chloride 105  96 - 112 mEq/L   CO2 25  19 - 32 mEq/L   Glucose, Bld 104 (*) 70 - 99 mg/dL   BUN 19  6 - 23 mg/dL   Creatinine, Ser 7.82  0.50 - 1.35 mg/dL   Calcium 9.7  8.4 - 95.6 mg/dL   Total Protein 7.2  6.0 - 8.3 g/dL   Albumin 4.1  3.5 - 5.2 g/dL   AST 23  0 - 37 U/L   ALT 9  0 - 53 U/L   Alkaline Phosphatase 69  39 - 117 U/L   Total Bilirubin 1.2  0.3 - 1.2 mg/dL   GFR calc non Af Amer 81 (*) >90 mL/min   GFR calc Af Amer >90  >90 mL/min  TROPONIN I      Component Value Range   Troponin I <0.30  <0.30 ng/mL  GLUCOSE, CAPILLARY      Component Value Range   Glucose-Capillary 101 (*) 70 - 99 mg/dL   Comment 1 Notify RN      Dg Chest 2 View  01/28/2012  *RADIOLOGY REPORT*  Clinical Data: Weakness.Diabetes.  CHF.  CHEST - 2 VIEW  Comparison: 09/24/2009.  Findings: The lungs are clear without focal infiltrate, edema, pneumothorax or pleural effusion. The cardiopericardial silhouette is enlarged.Mild vascular congestion noted.  The patient is status post median sternotomy.  Left-sided pacer / AICD remains in place. Bones are diffusely demineralized.  IMPRESSION: Cardiomegaly with mild vascular congestion.  No acute cardiopulmonary process.   Original Report Authenticated By: Kennith Center, [name]     Date: 01/29/2012  Rate: 86   Rhythm: normal sinus rhythm  QRS Axis: left  Intervals: normal  ST/T Wave abnormalities: nonspecific T wave changes  Conduction  Disutrbances:nonspecific intraventricular conduction delay  Narrative Interpretation:   Old EKG Reviewed: none available    1. Weakness   2. Cerebral embolism with cerebral infarction   3. Weakness of left side of body       MDM  Pt w/ hx of remote CVA, DM, CHF, CAD presents to ED for weakness. Was in normal state of health 5 days ago.  4 days ago was found laying on floor in his own feces, confused, dysarthric, globally weak w/ increased weakness noted on left. Was taken to [facility] long hospital. CT at that time [facility], was dehydrated, given IVF and sent home. Family states persistent weakness, confusion and dysarthria. No change or worsening of sx.   Exam: afebrile, normotensive, no resp distress or hypoxia, GCS 14, confused, slurred speech, left lower face droop, globally weak w/ slight worsening on left. Intentional finger/nose bilaterally. Remainder of exam unremarkable, pronator drift neg.   DDx and plan: concern for CVA vs infectious etiology. Will check CXR, cmp, cbc, pt/inr, troponin, u/a and discuss w/ neurology regarding MRI vs repeat CT head.   Course: reassessed, vitals stable, NAD, CXR - NACPF, ECG neg for acute ischemia, glucose 101, cmp, cbc, troponin, coags all unremarkable. D/w neurology - recommend admit to medicine and perform inpt MRI. Refrain from repeat CT head at this time. Internal medicine consulted and pt admitted in stable condition for CVA/weakness work up.   1. Weakness   2. Cerebral embolism with cerebral infarction   3. Weakness of left side of body           Audelia Hives, MD 01/29/12 0111

## 2012-01-29 NOTE — Progress Notes (Signed)
  Echocardiogram 2D Echocardiogram has been performed.  Carolan Avedisian 01/29/2012, 2:34 PM

## 2012-01-29 NOTE — Progress Notes (Signed)
Physical Therapy Evaluation Note  Past Medical History  Diagnosis Date  . Diabetes mellitus without complication   . CHF (congestive heart failure)   . Stroke   . Gout   . Arthritis   . Ischemic cardiomyopathy     Past Surgical History  Procedure Date  . Cardiac surgery      01/29/12 1113  PT Visit Information  Last PT Received On 01/29/12  Assistance Needed +2  PT Time Calculation  PT Start Time 1113  PT Stop Time 1145  PT Time Calculation (min) 32 min  Subjective Data  Subjective Pt received supine in bed with wife at bedside.  Precautions  Precautions Fall  Precaution Comments pt with h/o dementia  Restrictions  Weight Bearing Restrictions No  Home Living  Lives With Spouse (and niece)  Available Help at Discharge Family;Available PRN/intermittently (wife not capable of assisting patient but is there 24/7)  Type of Home House  Home Access Stairs to enter  Entrance Stairs-Number of Steps 2 sets of 6  Entrance Stairs-Rails Right  Home Layout Two level;Able to live on main level with bedroom/bathroom  Bathroom Nurse, children's Yes  How Accessible Accessible via walker  Home Adaptive Equipment Walker - rolling  Prior Function  Level of Independence Independent with assistive device(s)  Able to Take Stairs? Yes  Driving No  Comments spouse reports patient was able to dress/bath/feed himself.   Communication  Communication HOH  Cognition  Overall Cognitive Status History of cognitive impairments - at baseline  Arousal/Alertness Awake/alert  Orientation Level Disoriented to;Place;Time;Situation  Behavior During Session [facility] for tasks performed  Right Upper Extremity Assessment  RUE ROM/Strength/Tone Unable to fully assess;Due to impaired cognition  Left Upper Extremity Assessment  LUE ROM/Strength/Tone WFL;Unable to fully assess;Due to impaired cognition  Right Lower Extremity Assessment  RLE  ROM/Strength/Tone Unable to fully assess;Due to impaired cognition  Left Lower Extremity Assessment  LLE ROM/Strength/Tone Unable to fully assess;Due to impaired cognition  Trunk Assessment  Trunk Assessment Normal  Bed Mobility  Bed Mobility Supine to Sit;Sitting - Scoot to Edge of Bed  Supine to Sit 2: Max assist;HOB elevated  Sitting - Scoot to [place] of Bed 3: Mod assist  Details for Bed Mobility Assistance max directional v/c's, pt with poor initiation, poor sequencing  Transfers  Transfers Sit to Stand;Stand to Sit;Stand Pivot Transfers  Sit to Stand 1: +2 Total assist;With upper extremity assist;From bed (x4 attempts prior to full upright success)  Sit to Stand: Patient Percentage 40%  Stand to Sit 3: Mod assist;With armrests;To chair/3-in-1 (assist to control descent)  Stand Pivot Transfers 4: Min assist (with RW)  Details for Transfer Assistance due to cognition patient required numerous verbal cues to achieve task. pt initially required maxAX2 but then transitioned to minA for std pvt transfer with RW. Patient however impulsively sat in chair and wouldn't walk any further.  Ambulation/Gait  Ambulation/Gait Assistance Not tested (comment) (only completed stand pvt)  PT - End of Session  Equipment Utilized During Treatment Gait belt  Activity Tolerance (limited by dementia)  Patient left in chair;with call bell/phone within reach;with family/visitor present  Nurse Communication Mobility status  PT Assessment  Clinical Impression Statement Pt is an 79 yo male with h/o dementia who was admitted for possible right sided CVA however unable to do MRI due to pacemaker. per spouse patient was independent from a mobiltiy stand point PTA however patient now requires maxA for adls and all  OOB mobiltiy. Patient to benefit from SNF placement upon d/c to achieve maximal functional mobiltiy recovery for safe transition home due to wife physically unable to assist patient. Spouse aware and  agrees.  PT Recommendation/Assessment Patient needs continued PT services  PT Problem List Decreased strength;Decreased activity tolerance;Decreased balance;Decreased mobility;Decreased coordination  Barriers to Discharge Decreased caregiver support  PT Therapy Diagnosis  Difficulty walking  PT Plan  PT Frequency Min 3X/week  PT Treatment/Interventions DME instruction;Gait training;Stair training;Functional mobility training;Therapeutic activities;Therapeutic exercise;Balance training  PT Recommendation  Follow Up Recommendations SNF;Supervision/Assistance - 24 hour  PT equipment None recommended by PT  Individuals Consulted  Consulted and Agree with Results and Recommendations Family member/caregiver  Family Member Consulted spouse  Acute Rehab PT Goals  PT Goal Formulation With family  Time For Goal Achievement 02/05/12  Potential to Achieve Goals Fair  Pt will go Supine/Side to Sit with min assist;with HOB 0 degrees  PT Goal: Supine/Side to Sit - Progress Goal set today  Pt will go Sit to Supine/Side with min assist;with HOB 0 degrees  PT Goal: Sit to Supine/Side - Progress Goal set today  Pt will go Sit to Stand with min assist;with upper extremity assist (up to RW)  PT Goal: Sit to Stand - Progress Goal set today  Pt will Transfer Bed to Chair/Chair to Bed with min assist (with RW)  PT Transfer Goal: Bed to Chair/Chair to Bed - Progress Goal set today  Pt will Ambulate 16 - 50 feet;with min assist;with rolling walker  PT Goal: Ambulate - Progress Goal set today  Pt will Go Up / Down Stairs 6-9 stairs;with min assist;with rail(s)  PT Goal: Up/Down Stairs - Progress Goal set today  PT G-Codes **NOT FOR INPATIENT CLASS**  Functional Assessment Tool Used clinical judgement  Functional Limitation Mobility: Walking and moving around  Mobility: Walking and Moving Around Current Status (J1914) CL  Mobility: Walking and Moving Around Goal Status (N8295) CJ  PT General Charges  $$  ACUTE PT VISIT 1 Procedure  PT Treatments  $Gait Training 8-22 mins  Written Expression  Dominant Hand Right    Pain: pt denies pain  Lewis Shock, PT, DPT Pager #: [phone] Office #: [phone]

## 2012-01-29 NOTE — Progress Notes (Signed)
Stroke Team Progress Note  HISTORY Jorge Klein is an 79 y.o. male who was LKW 01/24/2012 evening prior to going to bed around 2100. He was found beside the bed around 0200 on 01/25/2012, his family thought that he slipped out of bed. Some confusion, although he does have some confusion at baseline. The daughter went to work later that day and returned around 2200 same day. He was sitting in the floor and he was brought to [facility] ER. He was discharged back to home via EMS per daughter after he was able to ambulate to the door of his ER room. He did have left sided weakness and his confusion was worse. He has continued to have these problems and felt that they have worsened and took him to see Dr. Kirby Funk today and he was directed to the ER for admission today. Patient was not a TPA candidate secondary to delay in arrivan. He was admitted for further evaluation and treatment.  SUBJECTIVE His wife is at the bedside.  Overall he feels his condition is stable. His wife denies he has a history of dementia.  OBJECTIVE Most recent Vital Signs: Filed Vitals:   01/28/12 1905 01/28/12 2107 01/29/12 0400 01/29/12 0800  BP: 148/100 137/90 144/84 146/87  Pulse: 82 84 82 80  Temp: 98.4 F (36.9 C) 97.5 F (36.4 C) 98.2 F (36.8 C) 98.5 F (36.9 C)  TempSrc: Oral Oral Oral Axillary  Resp: 20 20 20 20   Height: 6\' 4"  (1.93 m) 6\' 3"  (1.905 m)    Weight: 112 kg (246 lb 14.6 oz) 111.8 kg (246 lb 7.6 oz) 111.7 kg (246 lb 4.1 oz)   SpO2: 96% 95% 94% 96%   CBG (last 3)   Basename 01/29/12 0752 01/28/12 2116  GLUCAP 94 101*   IV Fluid Intake:     MEDICATIONS    .  stroke: mapping our early stages of recovery book   Does not apply Once  . aspirin  325 mg Oral Daily  . atorvastatin  10 mg Oral q1800  . carvedilol  12.5 mg Oral BID WC  . influenza  inactive virus vaccine  0.5 mL Intramuscular Tomorrow-1000  . insulin aspart  0-9 Units Subcutaneous TID WC  . warfarin  4 mg Oral Q72H  . warfarin  5  mg Oral Q72H  . warfarin  5 mg Oral Q72H  . Warfarin - Physician Dosing Inpatient   Does not apply q1800   PRN:    Diet:  Cardiac thin liquids Activity:   Bathroom privileges with assistance DVT Prophylaxis:  coumadin  CLINICALLY SIGNIFICANT STUDIES Basic Metabolic Panel:   Lab 01/28/12 1636 01/26/12 0110  NA 142 141  K 4.7 4.3  CL 105 106  CO2 25 24  GLUCOSE 104* 129*  BUN 19 19  CREATININE 0.79 0.79  CALCIUM 9.7 9.3  MG -- --  PHOS -- --   Liver Function Tests:   Lab 01/28/12 1636  AST 23  ALT 9  ALKPHOS 69  BILITOT 1.2  PROT 7.2  ALBUMIN 4.1   CBC:   Lab 01/28/12 1636 01/26/12 0110  WBC 6.3 7.6  NEUTROABS 4.4 --  HGB 15.0 15.4  HCT 46.6 45.2  MCV 95.7 94.4  PLT 143* 152   Coagulation:   Lab 01/29/12 0625 01/28/12 1636  LABPROT 15.8* 14.2  INR 1.29 1.11   Cardiac Enzymes:   Lab 01/28/12 1630 01/26/12 0615  CKTOTAL -- 79  CKMB -- 3.9  CKMBINDEX -- --  TROPONINI <0.30 --   Urinalysis:   Lab 01/26/12 0310  COLORURINE AMBER*  LABSPEC 1.029  PHURINE 5.5  GLUCOSEU NEGATIVE  HGBUR MODERATE*  BILIRUBINUR SMALL*  KETONESUR TRACE*  PROTEINUR NEGATIVE  UROBILINOGEN 1.0  NITRITE NEGATIVE  LEUKOCYTESUR NEGATIVE   Lipid Panel No results found for this basename: chol,  trig,  hdl,  cholhdl,  vldl,  ldlcalc   HgbA1C  Lab Results  Component Value Date   HGBA1C 5.6 01/28/2012    Urine Drug Screen:   No results found for this basename: labopia,  cocainscrnur,  labbenz,  amphetmu,  thcu,  labbarb    Alcohol Level: No results found for this basename: ETH:2 in the last 168 hours  CT of the brain   01/26/2012 1. No acute intracranial pathology seen on CT. 2. Mild cortical volume loss and diffuse small vessel ischemic microangiopathy noted. 3. Chronic infarct at the left frontoparietal region, with associated encephalomalacia.  MRI of the brain  pacer  MRA of the brain  pacer  2D Echocardiogram    Carotid Doppler    CXR  01/28/2012 Cardiomegaly  with mild vascular congestion.  No acute cardiopulmonary process.  EKG  normal sinus rhythm.   Therapy Recommendations   Physical Exam   Pleasant elderly caucasian male not in distress.Awake alert. Afebrile. Head is nontraumatic. Neck is supple without bruit. Hearing is diminished. Cardiac exam no murmur or gallop. Lungs are clear to auscultation. Distal pulses are well felt.  Neurological Exam : Awake alert oriented x 2 normal speech and language.Diminished attention, registration and recall.Extraocular movements full range. Fundi not visualized.visual acuity and fields adequate. Mild left lower face asymmetry. Tongue midline. No drift. Mild diminished fine finger movements on left. Orbits right over left upper extremity. Mild left grip weak.. Normal sensation . Normal coordination.  ASSESSMENT [name] is a 79 y.o. male presenting with left sided weakness and increased confusion. Right brain infarct suspected. Infarct felt to be embolic secondary to known atrial fibrillation.  Work up underway. On warfarin prior to admission, though INR on arrival was 1.11. Now on aspirin 325 mg orally every day and warfarin for secondary stroke prevention. Patient with resultant confusion and left hemiparesis. Suspect underlying dementia at baseline.            atrial fibrillation, on coumadin  Hypertension Diabetes, HgbA1c 5.6  Hx stroke  Baseline dementia  Pacemaker  Hospital day # 1  TREATMENT/PLAN  Continue warfarin for secondary stroke prevention. Continue aspirin until INR is therapeutic, then discontinue  Repeat CT head to confirm stroke  Check lipid panel  2D and carotid dopplers  Therapy evals  D/w patient and wife and answered questions.  Annie Main, MSN, RN, ANVP-BC, ANP-BC, [facility] Pager: [phone] 01/29/2012 11:10 AM  I have personally obtained a history, examined the patient, evaluated imaging results, and formulated the assessment and  plan of care. I agree with the above.   Delia Heady, MD Medical Director [facility] Pager: [phone] 01/29/2012 12:39 PM

## 2012-01-29 NOTE — Progress Notes (Signed)
*  PRELIMINARY RESULTS* Vascular Ultrasound Carotid Duplex (Doppler) has been completed.  Preliminary findings: Right= <40% ICA stenosis. Left= Occluded ICA. Cannot determine homogenous plaque vs thrombus.  Farrel Demark, RDMS, RVT 01/29/2012, 2:51 PM

## 2012-01-29 NOTE — Progress Notes (Signed)
Patient seen and examined. Agree with note by Toya Smothers, NP. Here with suspected CVA. Unable to get MRI given h/o PPM. Complete CVA workup.  Peggye Pitt, MD [facility] Pager: [phone]

## 2012-01-29 NOTE — Progress Notes (Signed)
[facility] PROGRESS NOTE  Jorge Klein AVW:[id number] DOB: 05-17-1927 DOA: 01/28/2012 PCP: Ginette Otto, MD  Assessment/Plan: Weakness of left side of body: Suspicious for CVA. CT scan negative done on 12/31. Unable to get MRI because a pacemaker. Neurology on board and they feel no new CT as necessary. Stroke workup in progress. Symptoms presumably likely from embolic CVA brought on by noncompliance with Coumadin. PT/OT echo and doppler pending.  Active Problems:  Dementia: appears at baseline this am.   HTN (hypertension): Fair control. Will monitor and continue home meds  History of CVA (cerebrovascular accident): Noted. See above  Pacemaker: Stable. Unable to get MRI  A-fib: Currently rate controlled. Pharmacy to dose coumadin. INR 1.29 this am  Code Status: full Family Communication:  Disposition Plan: 1-2 days. May benefit from placement. Will request SW consult   Consultants:  neuro  Procedures:  none  Antibiotics:  none  HPI/Subjective: Awake alert oriented to self only. Cooperative and responds appropriately. Denies pain/discomfort. NAD  Objective: Filed Vitals:   01/28/12 1742 01/28/12 1905 01/28/12 2107 01/29/12 0400  BP: 141/80 148/100 137/90 144/84  Pulse: 79 82 84 82  Temp:  98.4 F (36.9 C) 97.5 F (36.4 C) 98.2 F (36.8 C)  TempSrc:  Oral Oral Oral  Resp: 22 20 20 20   Height:  6\' 4"  (1.93 m) 6\' 3"  (1.905 m)   Weight:  112 kg (246 lb 14.6 oz) 111.8 kg (246 lb 7.6 oz) 111.7 kg (246 lb 4.1 oz)  SpO2: 98% 96% 95% 94%   No intake or output data in the 24 hours ending 01/29/12 0743 Filed Weights   01/28/12 1905 01/28/12 2107 01/29/12 0400  Weight: 112 kg (246 lb 14.6 oz) 111.8 kg (246 lb 7.6 oz) 111.7 kg (246 lb 4.1 oz)    Exam:   General:  Awake alert oriented to self only  Cardiovascular: irregularly irregular No MGR No LEE  Respiratory: normal effort BSCTAB No wheeze/rhonchi  Abdomen: soft nontender to palpation  +BS  Neuro: cranial nerve II-XII intact. Grips with 5/5 on right 4/5 on left  Data Reviewed: Basic Metabolic Panel:  Lab 01/28/12 8295 01/26/12 0110  NA 142 141  K 4.7 4.3  CL 105 106  CO2 25 24  GLUCOSE 104* 129*  BUN 19 19  CREATININE 0.79 0.79  CALCIUM 9.7 9.3  MG -- --  PHOS -- --   Liver Function Tests:  Lab 01/28/12 1636  AST 23  ALT 9  ALKPHOS 69  BILITOT 1.2  PROT 7.2  ALBUMIN 4.1   No results found for this basename: LIPASE:5,AMYLASE:5 in the last 168 hours No results found for this basename: AMMONIA:5 in the last 168 hours CBC:  Lab 01/28/12 1636 01/26/12 0110  WBC 6.3 7.6  NEUTROABS 4.4 --  HGB 15.0 15.4  HCT 46.6 45.2  MCV 95.7 94.4  PLT 143* 152   Cardiac Enzymes:  Lab 01/28/12 1630 01/26/12 0615  CKTOTAL -- 79  CKMB -- 3.9  CKMBINDEX -- --  TROPONINI <0.30 --   BNP (last 3 results) No results found for this basename: PROBNP:3 in the last 8760 hours CBG:  Lab 01/28/12 2116 01/26/12 0122  GLUCAP 101* 121*    No results found for this or any previous visit (from the past 240 hour(s)).   Studies: Dg Chest 2 View  01/28/2012  *RADIOLOGY REPORT*  Clinical Data: Weakness.Diabetes.  CHF.  CHEST - 2 VIEW  Comparison: 09/24/2009.  Findings: The lungs are clear without focal  infiltrate, edema, pneumothorax or pleural effusion. The cardiopericardial silhouette is enlarged.Mild vascular congestion noted.  The patient is status post median sternotomy.  Left-sided pacer / AICD remains in place. Bones are diffusely demineralized.  IMPRESSION: Cardiomegaly with mild vascular congestion.  No acute cardiopulmonary process.   Original Report Authenticated By: Kennith Center, [name]     Scheduled Meds:   .  stroke: mapping our early stages of recovery book   Does not apply Once  . sodium chloride   Intravenous STAT  . aspirin  325 mg Oral Daily  . atorvastatin  10 mg Oral q1800  . carvedilol  12.5 mg Oral BID WC  . influenza  inactive virus vaccine  0.5 mL  Intramuscular Tomorrow-1000  . insulin aspart  0-9 Units Subcutaneous TID WC  . warfarin  4 mg Oral Q72H  . warfarin  5 mg Oral Q72H  . warfarin  5 mg Oral Q72H  . Warfarin - Physician Dosing Inpatient   Does not apply q1800   Continuous Infusions:   Principal Problem:  *Weakness of left side of body Active Problems:  Dementia, senile, with, delirium  HTN (hypertension)  History of CVA (cerebrovascular accident)  Pacemaker  A-fib  Cerebral embolism with cerebral infarction  Diabetes  Gout  Ischemic cardiomyopathy    Time spent: 30 minutes    [facility]  [facility]  If 8PM-8AM, please contact night-coverage at [email or web address], password [facility] 01/29/2012, 7:43 AM  LOS: 1 day

## 2012-01-29 NOTE — Progress Notes (Signed)
ANTICOAGULATION CONSULT NOTE - Follow Up Consult  Pharmacy Consult for Coumadin Indication: stroke, likely embolic; hx atrial fibrillation  No Known Allergies  Patient Measurements: Height: 6\' 3"  (190.5 cm) Weight: 246 lb 4.1 oz (111.7 kg) IBW/kg (Calculated) : 84.5   Vital Signs: Temp: 98.5 F (36.9 C) (01/03 0800) Temp src: Axillary (01/03 0800) BP: 146/87 mmHg (01/03 0800) Pulse Rate: 80  (01/03 0800)  Labs:  Basename 01/29/12 0625 01/28/12 1636 01/28/12 1630  HGB -- 15.0 --  HCT -- 46.6 --  PLT -- 143* --  APTT -- -- --  LABPROT 15.8* 14.2 --  INR 1.29 1.11 --  HEPARINUNFRC -- -- --  CREATININE -- 0.79 --  CKTOTAL -- -- --  CKMB -- -- --  TROPONINI -- -- <0.30    Estimated Creatinine Clearance: 96.1 ml/min (by C-G formula based on Cr of 0.79).  Assessment:   Hx atrial fibrillation/flutter, and prior CVA. Has pacemaker. New CVA, when INR was subtherapeutic (1.11). Some hx non-compliance noted.   Home Coumadin regimen: 5 mg, 5 mg, 4 mg, then repeat.  Coumadin 7.5 mg given 1/2. Aspirin 325 mg daily added on admission; plan noted to stop ASA when INR >2.  Platelet count just below normal range. No bleeding noted.  Goal of Therapy:  INR 2-3 Monitor platelets by anticoagulation protocol: Yes   Plan:   Repeat Coumadin 7.5 mg today.  Continue daily PT/INR.  Aspirin to stop when INR >2.  Will follow up CBC/platelet count.  Dennie Fetters, RPh Pager: [phone] 01/29/2012,2:17 PM

## 2012-01-29 NOTE — Progress Notes (Signed)
Dr. Ardyth Harps notified of preliminary carotids results.  No new orders received.

## 2012-01-30 ENCOUNTER — Observation Stay (HOSPITAL_COMMUNITY): Payer: Medicare PPO

## 2012-01-30 DIAGNOSIS — I10 Essential (primary) hypertension: Secondary | ICD-10-CM

## 2012-01-30 DIAGNOSIS — I2589 Other forms of chronic ischemic heart disease: Secondary | ICD-10-CM

## 2012-01-30 DIAGNOSIS — F05 Delirium due to known physiological condition: Secondary | ICD-10-CM

## 2012-01-30 DIAGNOSIS — Z950 Presence of cardiac pacemaker: Secondary | ICD-10-CM

## 2012-01-30 DIAGNOSIS — I4891 Unspecified atrial fibrillation: Secondary | ICD-10-CM

## 2012-01-30 DIAGNOSIS — Z8673 Personal history of transient ischemic attack (TIA), and cerebral infarction without residual deficits: Secondary | ICD-10-CM

## 2012-01-30 LAB — CBC
HCT: 42 % (ref 39.0–52.0)
Hemoglobin: 14.4 g/dL (ref 13.0–17.0)
MCV: 94.6 fL (ref 78.0–100.0)
Platelets: 134 10*3/uL — ABNORMAL LOW (ref 150–400)
RBC: 4.44 MIL/uL (ref 4.22–5.81)
WBC: 6.8 10*3/uL (ref 4.0–10.5)

## 2012-01-30 LAB — URINALYSIS, ROUTINE W REFLEX MICROSCOPIC
Bilirubin Urine: NEGATIVE
Hgb urine dipstick: NEGATIVE
Ketones, ur: 15 mg/dL — AB
Nitrite: NEGATIVE
Specific Gravity, Urine: 1.017 (ref 1.005–1.030)
Urobilinogen, UA: 2 mg/dL — ABNORMAL HIGH (ref 0.0–1.0)

## 2012-01-30 LAB — GLUCOSE, CAPILLARY
Glucose-Capillary: 99 mg/dL (ref 70–99)
Glucose-Capillary: 99 mg/dL (ref 70–99)

## 2012-01-30 LAB — PROTIME-INR
INR: 1.73 — ABNORMAL HIGH (ref 0.00–1.49)
Prothrombin Time: 19.7 seconds — ABNORMAL HIGH (ref 11.6–15.2)

## 2012-01-30 MED ORDER — WARFARIN SODIUM 4 MG PO TABS
4.0000 mg | ORAL_TABLET | Freq: Once | ORAL | Status: AC
  Administered 2012-01-30: 4 mg via ORAL
  Filled 2012-01-30: qty 1

## 2012-01-30 NOTE — ED Provider Notes (Signed)
I have supervised the resident on the management of this patient and agree with the note above. I personally interviewed and examined the patient and my addendum is below.   Jorge Klein is a 79 y.o. male hx of CVA, DM, CHF, CAD here with weakness, confusion. Symptom onset 5 days ago. Went to [facility], had unremarkable CT. Was thought to be dehydrated so given IVF and was sent home. Symptoms persisted. She has slightly worse weakness on the L side. Vitals stable. Repeat labs and UA unremarkable. We talked to neuro, who recommend MRI and inpatient workup with medicine. Patient admitted to medicine for weakness and possible new CVA.    Richardean Canal, MD 01/30/12 [phone]

## 2012-01-30 NOTE — Progress Notes (Signed)
ANTICOAGULATION CONSULT NOTE - Follow Up Consult  Pharmacy Consult for: Coumadin Indication: stroke, likely embolic; hx atrial fibrillation  No Known Allergies  Patient Measurements: Height: 6\' 3"  (190.5 cm) Weight: 246 lb 4.1 oz (111.7 kg) IBW/kg (Calculated) : 84.5   Vital Signs: Temp: 98.6 F (37 C) (01/04 0400) BP: 145/97 mmHg (01/04 1006) Pulse Rate: 85  (01/04 1006)  Labs:  Basename 01/30/12 0420 01/29/12 0625 01/28/12 1636 01/28/12 1630  HGB 14.4 -- 15.0 --  HCT 42.0 -- 46.6 --  PLT 134* -- 143* --  APTT -- -- -- --  LABPROT 19.7* 15.8* 14.2 --  INR 1.73* 1.29 1.11 --  HEPARINUNFRC -- -- -- --  CREATININE -- -- 0.79 --  CKTOTAL -- -- -- --  CKMB -- -- -- --  TROPONINI -- -- -- <0.30    Estimated Creatinine Clearance: 96.1 ml/min (by C-G formula based on Cr of 0.79).  Assessment: 79 yo male with pacemaker and history atrial fibrillation/flutter and prior CVA found to have new CVA while INR was subtherapeutic (1.11). Question of non-compliance noted -- Reported home Coumadin regimen: 5 mg, 5 mg, 4 mg, then repeat.  INR up to 1.73 today after 2 doses of Coumadin 7.5 mg. Aspirin 325 mg daily added on admission to stop ASA when INR >2. Hgb 14.4, Plts 134. No bleeding noted.  Goal of Therapy:  INR 2-3 Monitor platelets by anticoagulation protocol: Yes   Plan:  1) Coumadin 4mg  PO x 1 tonight 2) Continue daily INR & monitor signs/symptoms bleeding 3) Aspirin to stop when INR >2   Benjaman Pott, PharmD    01/30/2012   12:58 PM

## 2012-01-30 NOTE — Progress Notes (Signed)
[facility]             Progress Note   Subjective: Resting comfortably.  Objective: Vital signs in last 24 hours: Temp:  [97.4 F (36.3 C)-98.6 F (37 C)] 97.5 F (36.4 C) (01/04 1329) Pulse Rate:  [82-89] 89  (01/04 1329) Resp:  [18-20] 20  (01/04 1329) BP: (117-145)/(80-97) 130/89 mmHg (01/04 1329) SpO2:  [93 %-98 %] 94 % (01/04 1329) Weight change:     Intake/Output from previous day: 01/03 0701 - 01/04 0700 In: 440 [P.O.:440] Out: 300 [Urine:300] Total I/O In: 180 [P.O.:180] Out: -    Physical Exam: General: sleeping. HEENT: No bruits, no goiter. Heart: Regular rate and rhythm, without murmurs, rubs, gallops. Lungs: Clear to auscultation bilaterally. Abdomen: Soft, nontender, nondistended, positive bowel sounds. Extremities: No clubbing cyanosis or edema with positive pedal pulses.    Lab Results: Basic Metabolic Panel:  Basename 01/28/12 1636  NA 142  K 4.7  CL 105  CO2 25  GLUCOSE 104*  BUN 19  CREATININE 0.79  CALCIUM 9.7  MG --  PHOS --   Liver Function Tests:  Basename 01/28/12 1636  AST 23  ALT 9  ALKPHOS 69  BILITOT 1.2  PROT 7.2  ALBUMIN 4.1   CBC:  Basename 01/30/12 0420 01/28/12 1636  WBC 6.8 6.3  NEUTROABS -- 4.4  HGB 14.4 15.0  HCT 42.0 46.6  MCV 94.6 95.7  PLT 134* 143*   Cardiac Enzymes:  Basename 01/28/12 1630  CKTOTAL --  CKMB --  CKMBINDEX --  TROPONINI <0.30   CBG:  Basename 01/30/12 1156 01/30/12 0731 01/29/12 2108 01/29/12 1647 01/29/12 1206 01/29/12 0752  GLUCAP 99 99 109* 101* 104* 94   Hemoglobin A1C:  Basename 01/28/12 2115  HGBA1C 5.6   Fasting Lipid Panel:  Basename 01/30/12 0420  CHOL 151  HDL 36*  LDLCALC 98  TRIG 84  CHOLHDL 4.2  LDLDIRECT --   Coagulation:  Basename 01/30/12 0420 01/29/12 0625  LABPROT 19.7* 15.8*  INR 1.73* 1.29    Studies/Results: Dg Chest 2 View  01/28/2012  *RADIOLOGY REPORT*  Clinical Data: Weakness.Diabetes.  CHF.  CHEST - 2 VIEW   Comparison: 09/24/2009.  Findings: The lungs are clear without focal infiltrate, edema, pneumothorax or pleural effusion. The cardiopericardial silhouette is enlarged.Mild vascular congestion noted.  The patient is status post median sternotomy.  Left-sided pacer / AICD remains in place. Bones are diffusely demineralized.  IMPRESSION: Cardiomegaly with mild vascular congestion.  No acute cardiopulmonary process.   Original Report Authenticated By: Kennith Center, [name]    Ct Head Wo Contrast  01/29/2012  *RADIOLOGY REPORT*  Clinical Data: Stroke.  Lethargy.  CT HEAD WITHOUT CONTRAST  Technique:  Contiguous axial images were obtained from the base of the skull through the vertex without contrast.  Comparison: 01/26/2012 and multiple previous  Findings: Old infarction in the left frontal and insular region appears the same.  Extensive chronic appearing small vessel changes throughout the hemispheric white matter bilaterally appear the same.  There is no identifiably acute infarction, mass lesion, hemorrhage, hydrocephalus or extra-axial collection.  The calvarium is unremarkable.  Sinuses, middle ears and mastoids are clear.  IMPRESSION: Old ischemic changes as outlined above.  No identifiably acute insult.   Original Report Authenticated By: Paulina Fusi, [name]     Medications: Scheduled Meds:   . aspirin  325 mg Oral Daily  . atorvastatin  10 mg Oral q1800  . carvedilol  12.5 mg Oral BID WC  .  insulin aspart  0-9 Units Subcutaneous TID WC  . warfarin  4 mg Oral ONCE-1800  . Warfarin - Pharmacist Dosing Inpatient   Does not apply q1800   Continuous Infusions:  PRN Meds:.LORazepam  Assessment/Plan:  Principal Problem:  *Weakness of left side of body Active Problems:  Dementia, senile, with, delirium  HTN (hypertension)  History of CVA (cerebrovascular accident)  Pacemaker  A-fib  Cerebral embolism with cerebral infarction  Diabetes  Gout  Ischemic cardiomyopathy   Left-sided  Weakness -Suspicious for CVA. -Unable to do MRI 2/2 pacer. -Will repeat CT Head in am. -On warfarin but subtherapeutic. -Will continue ASA until INR in therapeutic range. -Dopplers with an occluded left ICA. Discussed via telephone with vascular surgery, Dr. Arbie Cookey, who states there is nothing to offer given it is a  complete occlusion. -ECHO has an EF 25-30% that is unchanged from baseline. -LDL 98. -PT recs SNF vs 24 hour supervision. -Have discussed with daughter Lanora Manis who states they are not able to provide this. She has agreed to start a SNF search. Will alert SW.  AFIB -Rate controlled. -On coumadin.  Rest of chronic issues are stable.  Time spent coordinating care: 35 minutes   LOS: 2 days   [facility] Pager: [phone] 01/30/2012, 1:50 PM

## 2012-01-30 NOTE — Progress Notes (Signed)
Stroke Team Progress Note  HISTORY Jorge Klein is an 79 y.o. male who was LKW 01/24/2012 evening prior to going to bed around 2100. He was found beside the bed around 0200 on 01/25/2012, his family thought that he slipped out of bed. Some confusion, although he does have some confusion at baseline. The daughter went to work later that day and returned around 2200 same day. He was sitting in the floor and he was brought to [facility] ER. He was discharged back to home via EMS per daughter after he was able to ambulate to the door of his ER room. He did have left sided weakness and his confusion was worse. He has continued to have these problems and felt that they have worsened and took him to see Dr. Kirby Funk today and he was directed to the ER for admission today. Patient was not a TPA candidate secondary to delay in arrival. He was admitted for further evaluation and treatment.  SUBJECTIVE No one at the bedside.  Overall he feels his condition is stable. His wife denies he has a history of dementia. Pt partially oriented. Dysarthric.  OBJECTIVE Most recent Vital Signs: Filed Vitals:   01/30/12 0000 01/30/12 0400 01/30/12 1006 01/30/12 1329  BP: 135/86 139/97 145/97 130/89  Pulse: 82 84 85 89  Temp: 97.4 F (36.3 C) 98.6 F (37 C)  97.5 F (36.4 C)  TempSrc:    Axillary  Resp: 18 18  20   Height:      Weight:      SpO2: 97% 93%  94%   CBG (last 3)   Basename 01/30/12 1156 01/30/12 0731 01/29/12 2108  GLUCAP 99 99 109*   IV Fluid Intake:     MEDICATIONS     . aspirin  325 mg Oral Daily  . atorvastatin  10 mg Oral q1800  . carvedilol  12.5 mg Oral BID WC  . insulin aspart  0-9 Units Subcutaneous TID WC  . warfarin  4 mg Oral ONCE-1800  . Warfarin - Pharmacist Dosing Inpatient   Does not apply q1800   PRN:    Diet:  Cardiac thin liquids Activity:   Bathroom privileges with assistance DVT Prophylaxis:  coumadin  CLINICALLY SIGNIFICANT STUDIES Basic Metabolic Panel:   Lab  01/28/12 1636 01/26/12 0110  NA 142 141  K 4.7 4.3  CL 105 106  CO2 25 24  GLUCOSE 104* 129*  BUN 19 19  CREATININE 0.79 0.79  CALCIUM 9.7 9.3  MG -- --  PHOS -- --   Liver Function Tests:   Lab 01/28/12 1636  AST 23  ALT 9  ALKPHOS 69  BILITOT 1.2  PROT 7.2  ALBUMIN 4.1   CBC:   Lab 01/30/12 0420 01/28/12 1636  WBC 6.8 6.3  NEUTROABS -- 4.4  HGB 14.4 15.0  HCT 42.0 46.6  MCV 94.6 95.7  PLT 134* 143*   Coagulation:   Lab 01/30/12 0420 01/29/12 0625 01/28/12 1636  LABPROT 19.7* 15.8* 14.2  INR 1.73* 1.29 1.11   Cardiac Enzymes:   Lab 01/28/12 1630 01/26/12 0615  CKTOTAL -- 79  CKMB -- 3.9  CKMBINDEX -- --  TROPONINI <0.30 --   Urinalysis:   Lab 01/26/12 0310  COLORURINE AMBER*  LABSPEC 1.029  PHURINE 5.5  GLUCOSEU NEGATIVE  HGBUR MODERATE*  BILIRUBINUR SMALL*  KETONESUR TRACE*  PROTEINUR NEGATIVE  UROBILINOGEN 1.0  NITRITE NEGATIVE  LEUKOCYTESUR NEGATIVE   Lipid Panel    Component Value Date/Time   CHOL  151 01/30/2012 0420   HgbA1C  Lab Results  Component Value Date   HGBA1C 5.6 01/28/2012    Urine Drug Screen:   No results found for this basename: labopia,  cocainscrnur,  labbenz,  amphetmu,  thcu,  labbarb    Alcohol Level: No results found for this basename: ETH:2 in the last 168 hours  CT of the brain   01/26/2012 1. No acute intracranial pathology seen on CT. 2. Mild cortical volume loss and diffuse small vessel ischemic microangiopathy noted. 3. Chronic infarct at the left frontoparietal region, with associated encephalomalacia.  CT of brain 01/30/12 IMPRESSION:  Atrophy and chronic ischemic changes.  No acute infarct and no change from prior scan.   MRI of the brain  pacer  MRA of the brain  pacer  2D Echocardiogram  EF 25 to 30% No thrombus noted  Carotid Doppler  Carotid Duplex (Doppler) has been completed. Preliminary findings: Right= <40% ICA stenosis. Left= Occluded ICA. Cannot determine homogenous plaque vs  thrombus.   CXR  01/28/2012 Cardiomegaly with mild vascular congestion.  No acute cardiopulmonary process.  EKG  normal sinus rhythm.   Therapy Recommendations   Physical Exam   Pleasant elderly caucasian male not in distress.Awake alert. Afebrile. Head is nontraumatic. Neck is supple without bruit. Hearing is diminished. Cardiac exam no murmur or gallop. Lungs are clear to auscultation. Distal pulses are well felt.  Neurological Exam : Awake alert oriented x 2 normal speech and language.Diminished attention, registration and recall.Extraocular movements full range. Fundi not visualized.visual acuity and fields adequate. Mild left lower face asymmetry. Tongue midline. No drift. Mild diminished fine finger movements on left. Orbits right over left upper extremity. Mild left grip weak.. Normal sensation . Normal coordination.  ASSESSMENT Jorge Klein is a 79 y.o. male presenting with left sided weakness and increased confusion. Right brain infarct suspected. Infarct felt to be embolic secondary to known atrial fibrillation.  Work up underway. On warfarin prior to admission, though INR on arrival was 1.11. Now on aspirin 325 mg orally every day and warfarin for secondary stroke prevention. Patient with resultant confusion and left hemiparesis. Suspect underlying dementia at baseline.            atrial fibrillation, on coumadin  Hypertension Diabetes, HgbA1c 5.6  Hx stroke  Baseline dementia  Pacemaker  Hospital day # 2  TREATMENT/PLAN  Continue warfarin for secondary stroke prevention. Continue aspirin until INR is therapeutic, then discontinue  Repeat CT head to confirm stroke - see above - negative for CVA  Check lipid panel  2D and carotid dopplers as above  Therapy evals - impaired cognition.  Severe LVD - ? AICD candidate  We will sign off   Delton See PA-C [facility] Pager [phone] 01/30/2012, 4:55 PM  01/30/2012 4:47 PM

## 2012-01-31 LAB — CBC
HCT: 42.9 % (ref 39.0–52.0)
MCHC: 34.3 g/dL (ref 30.0–36.0)
MCV: 93.5 fL (ref 78.0–100.0)
RDW: 13.5 % (ref 11.5–15.5)

## 2012-01-31 LAB — BASIC METABOLIC PANEL
BUN: 22 mg/dL (ref 6–23)
CO2: 21 mEq/L (ref 19–32)
Chloride: 108 mEq/L (ref 96–112)
Creatinine, Ser: 0.66 mg/dL (ref 0.50–1.35)
Glucose, Bld: 112 mg/dL — ABNORMAL HIGH (ref 70–99)

## 2012-01-31 LAB — GLUCOSE, CAPILLARY
Glucose-Capillary: 89 mg/dL (ref 70–99)
Glucose-Capillary: 103 mg/dL — ABNORMAL HIGH (ref 70–99)

## 2012-01-31 MED ORDER — WARFARIN SODIUM 2 MG PO TABS
2.0000 mg | ORAL_TABLET | Freq: Once | ORAL | Status: AC
  Administered 2012-01-31: 2 mg via ORAL
  Filled 2012-01-31: qty 1

## 2012-01-31 NOTE — Progress Notes (Signed)
ANTICOAGULATION CONSULT NOTE - Follow Up Consult  Pharmacy Consult for: Coumadin Indication: stroke, likely embolic; hx atrial fibrillation  No Known Allergies  Patient Measurements: Height: 6\' 3"  (190.5 cm) Weight: 243 lb 2.7 oz (110.3 kg) IBW/kg (Calculated) : 84.5   Vital Signs: Temp: 97.3 F (36.3 C) (01/05 1200) Temp src: Oral (01/05 1200) BP: 135/80 mmHg (01/05 1200) Pulse Rate: 84  (01/05 1200)  Labs:  Basename 01/31/12 0600 01/30/12 0420 01/29/12 0625 01/28/12 1636 01/28/12 1630  HGB 14.7 14.4 -- -- --  HCT 42.9 42.0 -- 46.6 --  PLT 128* 134* -- 143* --  APTT -- -- -- -- --  LABPROT 23.0* 19.7* 15.8* -- --  INR 2.14* 1.73* 1.29 -- --  HEPARINUNFRC -- -- -- -- --  CREATININE 0.66 -- -- 0.79 --  CKTOTAL -- -- -- -- --  CKMB -- -- -- -- --  TROPONINI -- -- -- -- <0.30    Estimated Creatinine Clearance: 95.5 ml/min (by C-G formula based on Cr of 0.66).  Assessment: 79 yo male with pacemaker and history atrial fibrillation/flutter and prior CVA being continued on coumadin for secondary stroke prevention. INR therapeutic today at 2.14, however relatively large increases over 3 days. Was on ASA until INR >2.  CBC is stable & no bleeding noted in chart. Patient's reported home dose was 5 mg, 5 mg, 4 mg, then repeat however question of compliance since INR on admission was 1.11.  Goal of Therapy:  INR 2-3 Monitor platelets by anticoagulation protocol: Yes   Plan:  1) Coumadin 2 mg x 1 tonight 2) Stop ASA since INR >2 3) Continue daily INR & monitor signs/symptoms bleeding    Benjaman Pott, PharmD    01/31/2012   2:08 PM

## 2012-01-31 NOTE — Progress Notes (Signed)
[facility]             Progress Note   Subjective: Resting comfortably. Wife present and updated on plan of care.  Objective: Vital signs in last 24 hours: Temp:  [97.4 F (36.3 C)-98.5 F (36.9 C)] 98 F (36.7 C) (01/05 0800) Pulse Rate:  [82-89] 83  (01/05 0800) Resp:  [14-26] 26  (01/05 0800) BP: (118-132)/(62-92) 118/78 mmHg (01/05 0800) SpO2:  [94 %-100 %] 100 % (01/05 0800) Weight:  [110.3 kg (243 lb 2.7 oz)] 110.3 kg (243 lb 2.7 oz) (01/05 0400) Weight change:  Last BM Date:  (pt unable to verbalize last BM)  Intake/Output from previous day: 01/04 0701 - 01/05 0700 In: 480 [P.O.:480] Out: 825 [Urine:825] Total I/O In: 120 [P.O.:120] Out: -    Physical Exam: General: sleeping, awakens easily and is able to answer questions appropriately. HEENT: No bruits, no goiter. Heart: Regular rate and rhythm, without murmurs, rubs, gallops. Lungs: Clear to auscultation bilaterally. Abdomen: Soft, nontender, nondistended, positive bowel sounds. Extremities: No clubbing cyanosis or edema with positive pedal pulses.    Lab Results: Basic Metabolic Panel:  Basename 01/31/12 0600 01/28/12 1636  NA 141 142  K 4.1 4.7  CL 108 105  CO2 21 25  GLUCOSE 112* 104*  BUN 22 19  CREATININE 0.66 0.79  CALCIUM 9.2 9.7  MG -- --  PHOS -- --   Liver Function Tests:  Basename 01/28/12 1636  AST 23  ALT 9  ALKPHOS 69  BILITOT 1.2  PROT 7.2  ALBUMIN 4.1   CBC:  Basename 01/31/12 0600 01/30/12 0420 01/28/12 1636  WBC 6.9 6.8 --  NEUTROABS -- -- 4.4  HGB 14.7 14.4 --  HCT 42.9 42.0 --  MCV 93.5 94.6 --  PLT 128* 134* --   Cardiac Enzymes:  Basename 01/28/12 1630  CKTOTAL --  CKMB --  CKMBINDEX --  TROPONINI <0.30   CBG:  Basename 01/31/12 0732 01/30/12 2036 01/30/12 1647 01/30/12 1156 01/30/12 0731 01/29/12 2108  GLUCAP 114* 111* 93 99 99 109*   Hemoglobin A1C:  Basename 01/28/12 2115  HGBA1C 5.6   Fasting Lipid Panel:  Basename  01/30/12 0420  CHOL 151  HDL 36*  LDLCALC 98  TRIG 84  CHOLHDL 4.2  LDLDIRECT --   Coagulation:  Basename 01/31/12 0600 01/30/12 0420  LABPROT 23.0* 19.7*  INR 2.14* 1.73*    Studies/Results: Ct Head Wo Contrast  01/30/2012  *RADIOLOGY REPORT*  Clinical Data: Left-sided weakness.  Atrial fib, on Coumadin.  Rule out CVA.  Pacemaker.  CT HEAD WITHOUT CONTRAST  Technique:  Contiguous axial images were obtained from the base of the skull through the vertex without contrast.  Comparison: CT 01/29/2012  Findings: Chronic left frontal temporal infarct is unchanged from prior studies.  Hypodensity throughout the cerebral white matter bilaterally is stable and consistent with chronic microvascular ischemia.  Negative for acute infarct.  Negative for hemorrhage or mass lesion.  Calvarium is intact.  IMPRESSION: Atrophy and chronic ischemic changes.  No acute infarct and no change from prior scan.   Original Report Authenticated By: Janeece Riggers, [name]    Ct Head Wo Contrast  01/29/2012  *RADIOLOGY REPORT*  Clinical Data: Stroke.  Lethargy.  CT HEAD WITHOUT CONTRAST  Technique:  Contiguous axial images were obtained from the base of the skull through the vertex without contrast.  Comparison: 01/26/2012 and multiple previous  Findings: Old infarction in the left frontal and insular region appears the same.  Extensive  chronic appearing small vessel changes throughout the hemispheric white matter bilaterally appear the same.  There is no identifiably acute infarction, mass lesion, hemorrhage, hydrocephalus or extra-axial collection.  The calvarium is unremarkable.  Sinuses, middle ears and mastoids are clear.  IMPRESSION: Old ischemic changes as outlined above.  No identifiably acute insult.   Original Report Authenticated By: Paulina Fusi, [name]     Medications: Scheduled Meds:    . atorvastatin  10 mg Oral q1800  . carvedilol  12.5 mg Oral BID WC  . insulin aspart  0-9 Units Subcutaneous TID WC  .  Warfarin - Pharmacist Dosing Inpatient   Does not apply q1800   Continuous Infusions:  PRN Meds:.LORazepam  Assessment/Plan:  Principal Problem:  *Weakness of left side of body Active Problems:  Dementia, senile, with, delirium  HTN (hypertension)  History of CVA (cerebrovascular accident)  Pacemaker  A-fib  Cerebral embolism with cerebral infarction  Diabetes  Gout  Ischemic cardiomyopathy   Left-sided Weakness -Suspicious for CVA. -Unable to do MRI 2/2 pacer. -Will repeat CT Head in am. -On warfarin but subtherapeutic. -DC ASA as INR is therapeutic. -Dopplers with an occluded left ICA. Discussed via telephone with vascular surgery, Dr. Arbie Cookey, who states there is nothing to offer given it is a  complete occlusion. -ECHO has an EF 25-30% that is unchanged from baseline. -LDL 98. -PT recs SNF vs 24 hour supervision. -Have discussed with daughter Lanora Manis who states they are not able to provide this. She has agreed to start a SNF search. Will alert SW.  AFIB -Rate controlled. -On coumadin.  Rest of chronic issues are stable.  Time spent coordinating care: 35 minutes   LOS: 3 days   HERNANDEZ ACOSTA,ESTELA [facility] Pager: [phone] 01/31/2012, 11:52 AM

## 2012-02-01 LAB — GLUCOSE, CAPILLARY
Glucose-Capillary: 116 mg/dL — ABNORMAL HIGH (ref 70–99)
Glucose-Capillary: 124 mg/dL — ABNORMAL HIGH (ref 70–99)

## 2012-02-01 LAB — PROTIME-INR: Prothrombin Time: 22.8 seconds — ABNORMAL HIGH (ref 11.6–15.2)

## 2012-02-01 MED ORDER — WARFARIN SODIUM 5 MG PO TABS
5.0000 mg | ORAL_TABLET | Freq: Once | ORAL | Status: AC
  Administered 2012-02-01: 5 mg via ORAL
  Filled 2012-02-01: qty 1

## 2012-02-01 NOTE — Progress Notes (Addendum)
Clinical Child psychotherapist spoke with Water engineer at [facility].  SNF authorization continues to be reviewed.  CSW submitted updated clinicals to [facility].    Clinical Social Worker received notification that pt's family requesting to speak with CSW.  CSW attempted to contact family at both numbers listed, no answer.    Angelia Mould, MSW, [place] [phone]

## 2012-02-01 NOTE — Progress Notes (Signed)
ANTICOAGULATION CONSULT NOTE - Follow Up Consult  Pharmacy Consult for: Coumadin Indication: stroke, likely embolic; hx atrial fibrillation  No Known Allergies  Patient Measurements: Height: 6\' 3"  (190.5 cm) Weight: 243 lb 2.7 oz (110.3 kg) IBW/kg (Calculated) : 84.5   Vital Signs: Temp: 99 F (37.2 C) (01/06 1136) Temp src: Oral (01/06 1136) BP: 125/85 mmHg (01/06 1136) Pulse Rate: 86  (01/06 1136)  Labs:  Basename 02/01/12 0458 01/31/12 0600 01/30/12 0420  HGB -- 14.7 14.4  HCT -- 42.9 42.0  PLT -- 128* 134*  APTT -- -- --  LABPROT 22.8* 23.0* 19.7*  INR 2.11* 2.14* 1.73*  HEPARINUNFRC -- -- --  CREATININE -- 0.66 --  CKTOTAL -- -- --  CKMB -- -- --  TROPONINI -- -- --    Estimated Creatinine Clearance: 95.5 ml/min (by C-G formula based on Cr of 0.66).  Assessment: 79 yo male with pacemaker and history atrial fibrillation/flutter and prior CVA being continued on coumadin for secondary stroke prevention.  Today's INR of 2.11 is therapeutic.He was on ASA until INR >2 thus ASA dc'd after dose on 01/30/12.  CBC yesterday was stable. No bleeding noted in chart. Patient's reported home dose was 5 mg, 5 mg, 4 mg, then repeat however question of compliance since INR on admission was 1.11.  Goal of Therapy:  INR 2-3 Monitor platelets by anticoagulation protocol: Yes   Plan:  1) Coumadin 5 mg x 1 tonight 2) Daily INR 3) Monitor signs/symptoms bleeding  Noah Delaine, RPh Clinical Pharmacist Pager: [phone] 02/01/2012   2:51 PM

## 2012-02-01 NOTE — Clinical Social Work Placement (Signed)
     Clinical Social Work Department CLINICAL SOCIAL WORK PLACEMENT NOTE 02/01/2012  Patient:  Jorge Klein, Jorge Klein  Account Number:  [id number] Admit date:  01/28/2012  Clinical Social Worker:  Margaree Mackintosh  Date/time:  02/01/2012 10:48 AM  Clinical Social Work is seeking post-discharge placement for this patient at the following level of care:   SKILLED NURSING   (*CSW will update this form in Epic as items are completed)   02/01/2012  Patient/family provided with [facility] Department of Clinical Social Works list of facilities offering this level of care within the geographic area requested by the patient (or if unable, by the patients family).  02/01/2012  Patient/family informed of their freedom to choose among providers that offer the needed level of care, that participate in Medicare, Medicaid or managed care program needed by the patient, have an available bed and are willing to accept the patient.  02/01/2012  Patient/family informed of [facility] ownership interest in [facility], as well as of the fact that they are under no obligation to receive care at this facility.  PASARR submitted to EDS on 02/01/2012 PASARR number received from EDS on 02/01/2012  FL2 transmitted to all facilities in geographic area requested by pt/family on  02/01/2012 FL2 transmitted to all facilities within larger geographic area on   Patient informed that his/her managed care company has contracts with or will negotiate with  certain facilities, including the following:     Patient/family informed of bed offers received:   Patient chooses bed at  Physician recommends and patient chooses bed at    Patient to be transferred to  on   Patient to be transferred to facility by   The following physician request were entered in Epic:   Additional Comments:

## 2012-02-01 NOTE — Progress Notes (Addendum)
PT Cancellation Note  Patient Details Name: Jorge Klein MRN: [id number] DOB: Mar 20, 1927   Cancelled Treatment:    Reason Eval/Treat Not Completed: Fatigue/lethargy limiting ability to participate  Pt not alert/awake and unable to open eyes to sternal rub.  Family reports pt given Ativan this morning.   Dechelle Attaway,KATHrine E 02/01/2012, 1:45 PM Pager: [phone]

## 2012-02-01 NOTE — Discharge Summary (Signed)
Physician Discharge Summary  Patient ID: Jorge Klein MRN: [id number] DOB/AGE: 02/17/1927 79 y.o.  Admit date: 01/28/2012 Discharge date: 02/01/2012  Primary Care Physician:  Ginette Otto, MD   Discharge Diagnoses:    Principal Problem:  *Weakness of left side of body Active Problems:  Dementia, senile, with, delirium  HTN (hypertension)  History of CVA (cerebrovascular accident)  Pacemaker  A-fib  Cerebral embolism with cerebral infarction  Diabetes  Gout  Ischemic cardiomyopathy      Medication List     As of 02/01/2012 10:37 AM    TAKE these medications         atorvastatin 10 MG tablet   Commonly known as: LIPITOR   Take 10 mg by mouth daily.      carvedilol 12.5 MG tablet   Commonly known as: COREG   Take 12.5 mg by mouth 2 (two) times daily with a meal.      warfarin 1 MG tablet   Commonly known as: COUMADIN   Take 1 mg by mouth as directed. Take with 4mg  tablet as directed. Total nightly dose is 5mg  on day 1, 5mg  on day 2, 4mg  on day 3. Repeat.      warfarin 4 MG tablet   Commonly known as: COUMADIN   Take 4 mg by mouth as directed. Take with 1mg  tablet as directed. Total nightly dose is 5mg  on day 1, 5mg  on day 2, 4mg  on day 3. Repeat         Disposition and Follow-up:  Will be discharged home today in stable and improved condition to SNF for rehab purposes.  Consults:  None   Significant Diagnostic Studies:  Ct Head Wo Contrast  01/30/2012  *RADIOLOGY REPORT*  Clinical Data: Left-sided weakness.  Atrial fib, on Coumadin.  Rule out CVA.  Pacemaker.  CT HEAD WITHOUT CONTRAST  Technique:  Contiguous axial images were obtained from the base of the skull through the vertex without contrast.  Comparison: CT 01/29/2012  Findings: Chronic left frontal temporal infarct is unchanged from prior studies.  Hypodensity throughout the cerebral white matter bilaterally is stable and consistent with chronic microvascular ischemia.  Negative for acute  infarct.  Negative for hemorrhage or mass lesion.  Calvarium is intact.  IMPRESSION: Atrophy and chronic ischemic changes.  No acute infarct and no change from prior scan.   Original Report Authenticated By: Janeece Riggers, [name]     Brief H and P: For complete details please refer to admission H and P, but in brief patient is an 79 y.o. male with a past history of previous CVA on Coumadin and pacemaker (suspected atrial fibrillation) as well as chronic dementia who lives at home with wife and children. Reports of medical noncompliance. Patient apparently in the last week has had a significant decline in ability to ambulate as well as get up out of bed requiring maximal assistance. Patient was brought into the emergency room on 12/31 and was evaluated. At that time CT scan of head was negative. Lab work was negative. Patient was sent home after with hydration, he was able to tolerate standing up and using a walker. Today he saw his PCP's partner and was noted to have left-sided weakness on exam. His weakness has since persisted from going to the emergency room and so his primary care physician's partner was concerned about CVA. Patient was sent over to the emergency room. Unable to get MRI because of pacemaker. In discussion with neurology, they did not recommend CT. INR  was noted to be subtherapeutic today at 1.16. Family says the patient is supposed to be taking his Coumadin. Hospitalist called for further evaluation and admission.     Hospital Course:  Principal Problem:  *Weakness of left side of body Active Problems:  Dementia, senile, with, delirium  HTN (hypertension)  History of CVA (cerebrovascular accident)  Pacemaker  A-fib  Cerebral embolism with cerebral infarction  Diabetes  Gout  Ischemic cardiomyopathy   Left-sided Weakness  -Suspicious for CVA.  -Unable to do MRI 2/2 pacer.  -On warfarin but subtherapeutic.  -DC ASA as INR is therapeutic.  -Dopplers with an occluded left  ICA. Discussed via telephone with vascular surgery, Dr. Arbie Cookey, who states there is nothing to offer given it is a complete occlusion.  -ECHO has an EF 25-30% that is unchanged from baseline.  -LDL 98.  -PT recs SNF vs 24 hour supervision.  -We will proceed to DC to SNF today if beds available.   AFIB  -Rate controlled.  -On coumadin.   Time spent on Discharge: Greater than 30 minutes  Signed: Chaya Jan [facility] Pager: [phone] 02/01/2012, 10:37 AM

## 2012-02-01 NOTE — Clinical Social Work Psychosocial (Signed)
     Clinical Social Work Department BRIEF PSYCHOSOCIAL ASSESSMENT 02/01/2012  Patient:  Jorge Klein, Jorge Klein     Account Number:  [id number]     Admit date:  01/28/2012  Clinical Social Worker:  Margaree Mackintosh  Date/Time:  02/01/2012 10:55 AM  Referred by:  Physician  Date Referred:  02/01/2012 Referred for  SNF Placement   Other Referral:   Interview type:  Family Other interview type:    PSYCHOSOCIAL DATA Living Status:  FAMILY Admitted from facility:   Level of care:   Primary support name:  Cameron Schwinn: [phone] Primary support relationship to patient:  CHILD, ADULT Degree of support available:   Adequate.    CURRENT CONCERNS Current Concerns  Post-Acute Placement   Other Concerns:    SOCIAL WORK ASSESSMENT / PLAN Clinical Social Worker recieved referral for SNF at [facility]. CSW reviewed chart and spoke with son.  CSW introduced self, explained role, and provided support.  Son confirmed interest in SNF with preference given to [facility].  CSW reviewed SNF process.  CSW contacted [facility] who will submit for authorization.  CSW left message with [employer] Medicare represenative to relay famliy's interest in SNF and that pt is ready for dc today. CSW to contineu to follow and assist as needed.   Assessment/plan status:  Information/Referral to [facility] Other assessment/ plan:   Information/referral to community resources:   SNF.    PATIENTS/FAMILYS RESPONSE TO PLAN OF CARE: Pt currently unable to fully particpiate in assessment due to medical condition.  Family was pleasant and engaged in conversation.  Family thanked CSW for intervention.

## 2012-02-01 NOTE — Progress Notes (Signed)
Patient transported to [facility] via EMS.  Per NT, patient's belongings were sent home with his family prior to discharge.  Alonza Bogus

## 2012-02-02 ENCOUNTER — Telehealth: Payer: Self-pay | Admitting: *Deleted

## 2012-02-02 IMAGING — CR DG CHEST 2V
2 series · 2 of 2 positions shown · non-contrast
Comparison: Portable chest x-ray of 05/31/2009

CLINICAL DATA: Status post CABG, chest pain, follow-up

CHEST - 2 VIEW

[w chest pa]
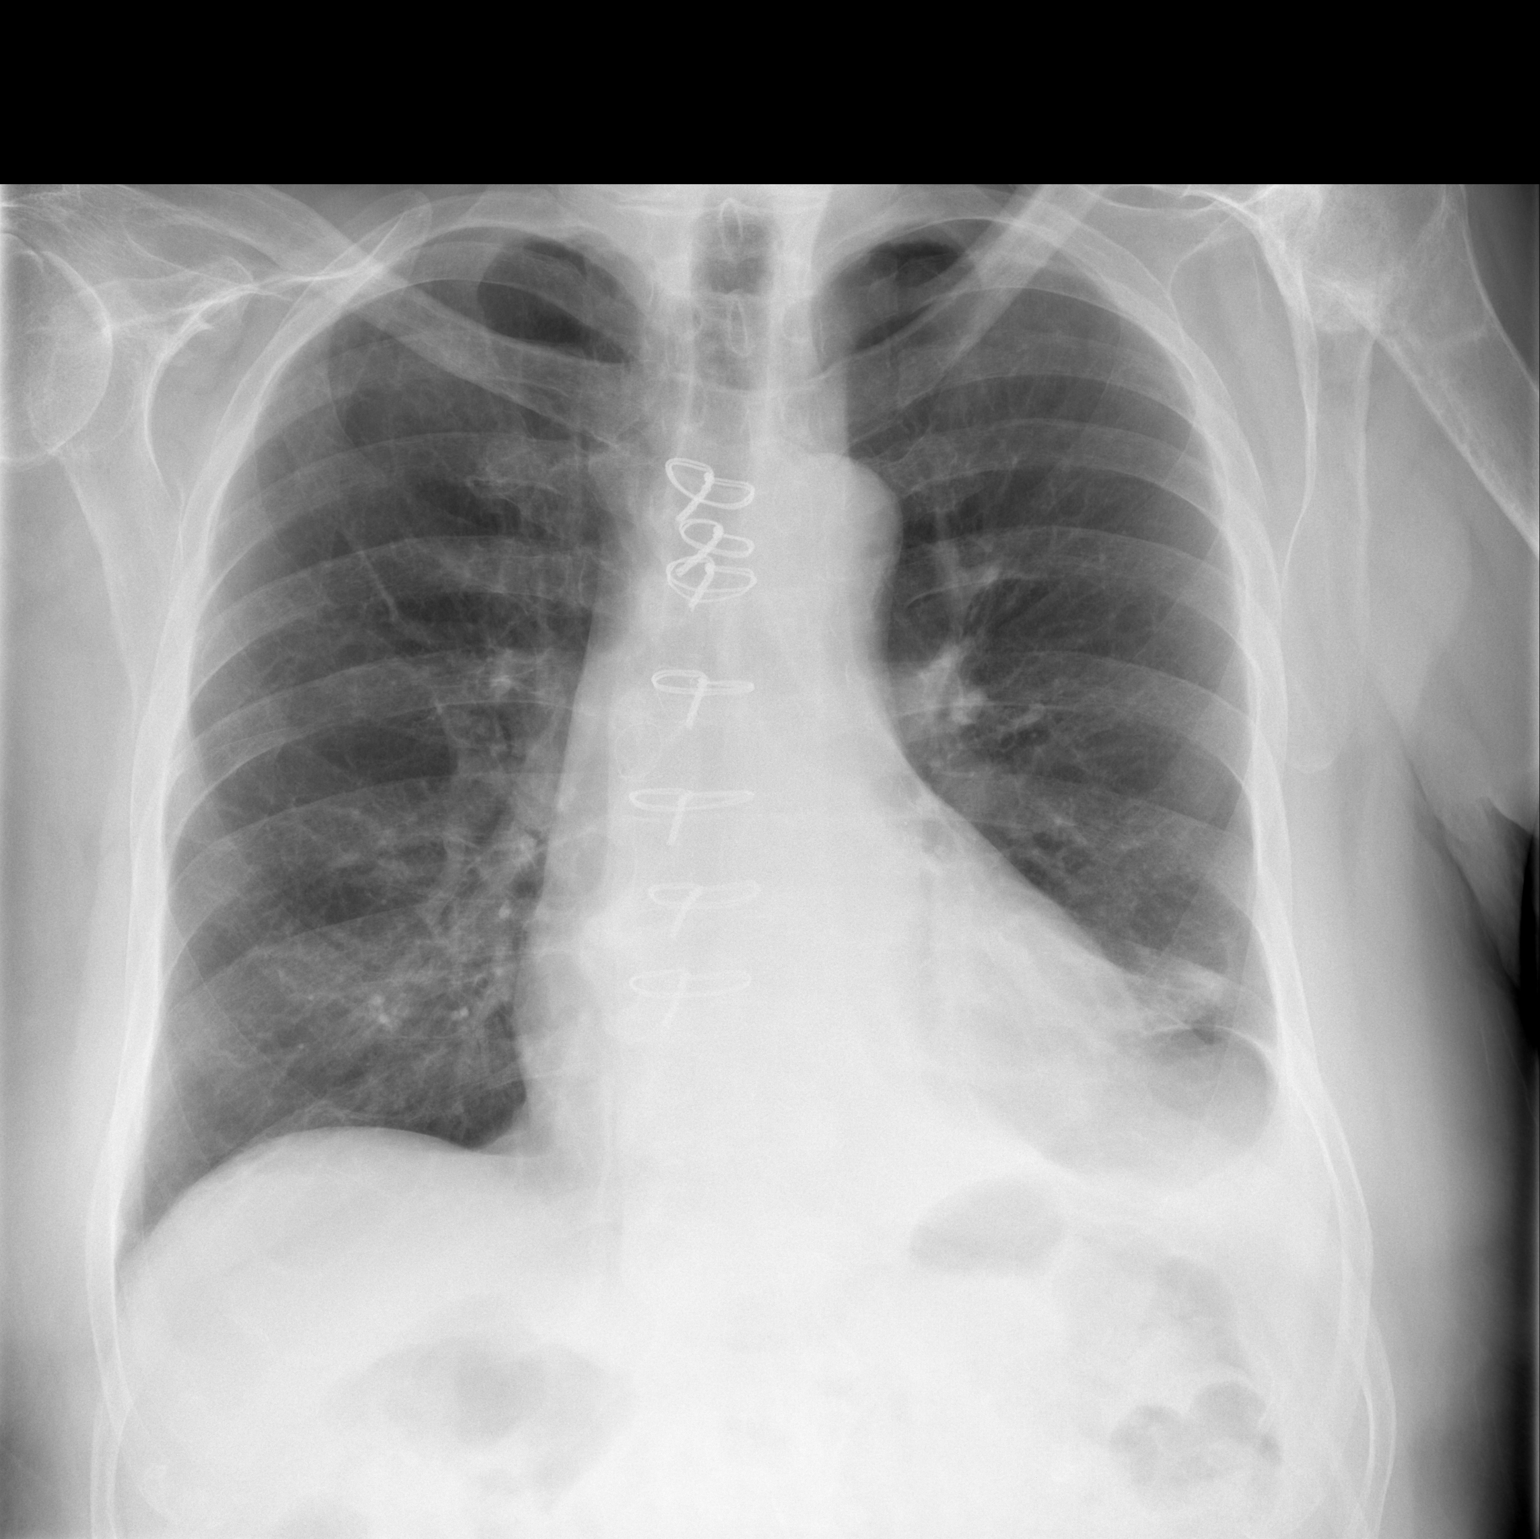

[w chest lat]
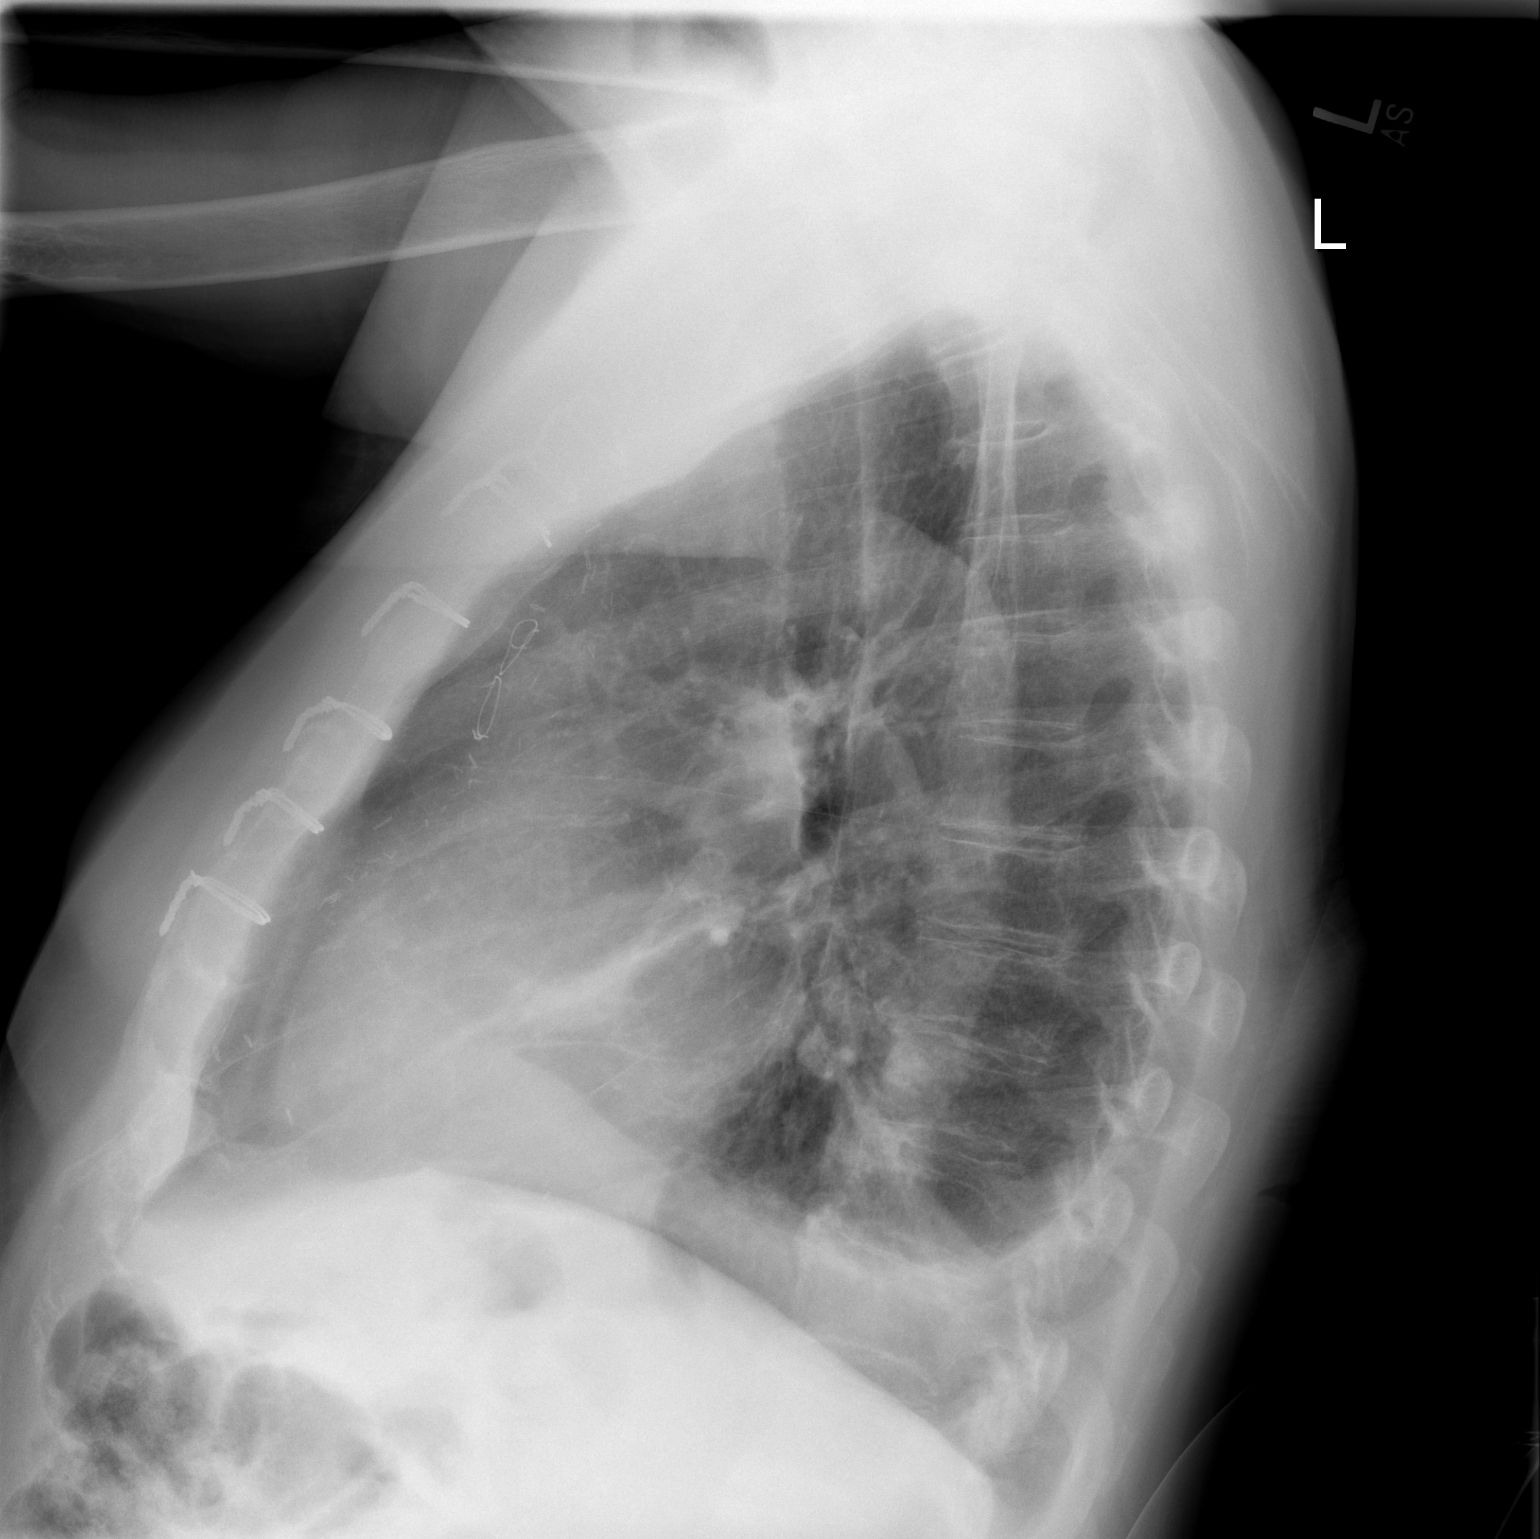

[2 of 2 positions shown; findings below may reference images not displayed]

FINDINGS: There is slightly more opacity at the left lung base
consistent with small left effusion and left basilar atelectasis.
The right lung is clear.  Mild cardiomegaly is stable.  Median
sternotomy sutures appear intact.  No acute bony abnormality is
seen.
IMPRESSION: There is now a small left pleural effusion present with mild left
basilar atelectasis.

## 2012-02-02 NOTE — Progress Notes (Signed)
Late entry from 02/01/2012. Pt cleared for admission by [facility].  Family outside of pt's room informed and agreeable to tx.  D/c paperwork prepared and PTAR called.

## 2012-02-02 NOTE — Telephone Encounter (Signed)
Clinical Social Worker reviewed chart and noted pt had been dc'd last night (around 7:30pm).  Clinical Social Worker spoke with Genia Harold at [facility].  CSW inquired about pt's dc.  Per Haze Boyden phoned unit [phone]) and stated "everything is taken care of on our end" and permitted pt to be dc'd. CSW to print off chart and create dc packet-Janie to inquire if someone from facility can pick packet up.  CSW staffed case with Chiropodist.    Angelia Mould, MSW, [place] [phone]

## 2012-05-16 ENCOUNTER — Encounter: Payer: Self-pay | Admitting: Internal Medicine

## 2012-09-21 ENCOUNTER — Emergency Department (HOSPITAL_COMMUNITY): Payer: Medicare PPO

## 2012-09-21 ENCOUNTER — Encounter (HOSPITAL_COMMUNITY): Payer: Self-pay | Admitting: Emergency Medicine

## 2012-09-21 ENCOUNTER — Inpatient Hospital Stay (HOSPITAL_COMMUNITY)
Admission: EM | Admit: 2012-09-21 | Discharge: 2012-09-28 | DRG: 308 | Disposition: A | Discharge status: Hospice | Payer: Medicare PPO | Attending: Cardiology | Admitting: Cardiology

## 2012-09-21 DIAGNOSIS — I2589 Other forms of chronic ischemic heart disease: Secondary | ICD-10-CM | POA: Diagnosis present

## 2012-09-21 DIAGNOSIS — Z951 Presence of aortocoronary bypass graft: Secondary | ICD-10-CM

## 2012-09-21 DIAGNOSIS — Z4502 Encounter for adjustment and management of automatic implantable cardiac defibrillator: Secondary | ICD-10-CM

## 2012-09-21 DIAGNOSIS — Z515 Encounter for palliative care: Secondary | ICD-10-CM

## 2012-09-21 DIAGNOSIS — R0600 Dyspnea, unspecified: Secondary | ICD-10-CM

## 2012-09-21 DIAGNOSIS — I472 Ventricular tachycardia, unspecified: Principal | ICD-10-CM | POA: Diagnosis present

## 2012-09-21 DIAGNOSIS — I5023 Acute on chronic systolic (congestive) heart failure: Secondary | ICD-10-CM | POA: Diagnosis present

## 2012-09-21 DIAGNOSIS — M129 Arthropathy, unspecified: Secondary | ICD-10-CM | POA: Diagnosis present

## 2012-09-21 DIAGNOSIS — I4891 Unspecified atrial fibrillation: Secondary | ICD-10-CM | POA: Diagnosis present

## 2012-09-21 DIAGNOSIS — I4729 Other ventricular tachycardia: Principal | ICD-10-CM | POA: Diagnosis present

## 2012-09-21 DIAGNOSIS — I4901 Ventricular fibrillation: Secondary | ICD-10-CM | POA: Diagnosis present

## 2012-09-21 DIAGNOSIS — Z8673 Personal history of transient ischemic attack (TIA), and cerebral infarction without residual deficits: Secondary | ICD-10-CM

## 2012-09-21 DIAGNOSIS — I959 Hypotension, unspecified: Secondary | ICD-10-CM | POA: Diagnosis present

## 2012-09-21 DIAGNOSIS — I255 Ischemic cardiomyopathy: Secondary | ICD-10-CM | POA: Diagnosis present

## 2012-09-21 DIAGNOSIS — F0390 Unspecified dementia without behavioral disturbance: Secondary | ICD-10-CM | POA: Diagnosis present

## 2012-09-21 DIAGNOSIS — Z95810 Presence of automatic (implantable) cardiac defibrillator: Secondary | ICD-10-CM

## 2012-09-21 DIAGNOSIS — I2510 Atherosclerotic heart disease of native coronary artery without angina pectoris: Secondary | ICD-10-CM | POA: Diagnosis present

## 2012-09-21 DIAGNOSIS — I6340 Cerebral infarction due to embolism of unspecified cerebral artery: Secondary | ICD-10-CM

## 2012-09-21 DIAGNOSIS — E785 Hyperlipidemia, unspecified: Secondary | ICD-10-CM | POA: Diagnosis present

## 2012-09-21 DIAGNOSIS — I252 Old myocardial infarction: Secondary | ICD-10-CM

## 2012-09-21 DIAGNOSIS — Z87891 Personal history of nicotine dependence: Secondary | ICD-10-CM

## 2012-09-21 DIAGNOSIS — I509 Heart failure, unspecified: Secondary | ICD-10-CM | POA: Diagnosis present

## 2012-09-21 DIAGNOSIS — E119 Type 2 diabetes mellitus without complications: Secondary | ICD-10-CM | POA: Diagnosis present

## 2012-09-21 DIAGNOSIS — Z7901 Long term (current) use of anticoagulants: Secondary | ICD-10-CM

## 2012-09-21 DIAGNOSIS — I10 Essential (primary) hypertension: Secondary | ICD-10-CM

## 2012-09-21 DIAGNOSIS — I4892 Unspecified atrial flutter: Secondary | ICD-10-CM | POA: Diagnosis present

## 2012-09-21 DIAGNOSIS — Z79899 Other long term (current) drug therapy: Secondary | ICD-10-CM

## 2012-09-21 DIAGNOSIS — R531 Weakness: Secondary | ICD-10-CM

## 2012-09-21 DIAGNOSIS — Z66 Do not resuscitate: Secondary | ICD-10-CM | POA: Diagnosis not present

## 2012-09-21 HISTORY — DX: Unspecified dementia, unspecified severity, without behavioral disturbance, psychotic disturbance, mood disturbance, and anxiety: F03.90

## 2012-09-21 HISTORY — DX: Acute myocardial infarction, unspecified: I21.9

## 2012-09-21 LAB — CBC
HCT: 46.2 % (ref 39.0–52.0)
MCV: 93.9 fL (ref 78.0–100.0)
RDW: 15 % (ref 11.5–15.5)
WBC: 10.4 10*3/uL (ref 4.0–10.5)

## 2012-09-21 LAB — PRO B NATRIURETIC PEPTIDE: Pro B Natriuretic peptide (BNP): 3636 pg/mL — ABNORMAL HIGH (ref 0–450)

## 2012-09-21 LAB — PROTIME-INR
INR: 1.78 — ABNORMAL HIGH (ref 0.00–1.49)
Prothrombin Time: 20.2 seconds — ABNORMAL HIGH (ref 11.6–15.2)

## 2012-09-21 LAB — BASIC METABOLIC PANEL
BUN: 22 mg/dL (ref 6–23)
Chloride: 100 mEq/L (ref 96–112)
Creatinine, Ser: 0.75 mg/dL (ref 0.50–1.35)

## 2012-09-21 LAB — MAGNESIUM: Magnesium: 2.1 mg/dL (ref 1.5–2.5)

## 2012-09-21 NOTE — ED Notes (Signed)
Cardiologist at bedside for assessment. Morin RN notified of delay

## 2012-09-21 NOTE — ED Notes (Signed)
Patient from home via [facility] with c/o AICD shock x 2 around 2100 tonight. Per wife, patient has had this for over 2 years and it has never fired off before. Patient denies any CP, SOB or nausea. VSS - BP 113/75 HR 98 RR 16

## 2012-09-21 NOTE — ED Notes (Signed)
MD at bedside to discuss plan of care

## 2012-09-21 NOTE — ED Notes (Addendum)
Received report from [employer] States pt had episode of Vfib at 2121 this evening and this was treated with two shocks (The first shock was unsuccessful). During the Vfib episode both the ventricular and the atrial rates were approx 240bpm.  Pt then had a non-sustained episode of VT immediately after the shock in which the Ventricular and atrial rates were both 222bpm. EDP made aware of update. Report to be faxed to [facility] by [employer].

## 2012-09-21 NOTE — ED Provider Notes (Signed)
CSN: [id number]     Arrival date & time 09/21/12  2140 History   First MD Initiated Contact with Patient 09/21/12 2157     Chief Complaint  Patient presents with  . AICD Problem   (Consider location/radiation/quality/duration/timing/severity/associated sxs/prior Treatment) HPI Comments: 79 year old male with a history of CHF and ischemic cardiomyopathy presents after having his AICD fired twice. His daughter states that she was moving him a out of his chair and he was shocked and then less than a minute later was shocked again. Patient never had a complaints prior to the firing. She never noted that he complained of chest pain, shortness of breath, or lightheadedness. He is at his mental baseline per the family. He has not been ill with fever chills or any other symptoms.  The history is provided by the spouse and a relative.    Past Medical History  Diagnosis Date  . Diabetes mellitus without complication   . CHF (congestive heart failure)   . Stroke   . Gout   . Arthritis   . Ischemic cardiomyopathy    Past Surgical History  Procedure Laterality Date  . Cardiac surgery  04/2009  . Cardiac defibrillator placement  09/23/2009    [employer]tronic Model [phone] Serial #[id number]   No family history on file. History  Substance Use Topics  . Smoking status: Former Games developer  . Smokeless tobacco: Not on file  . Alcohol Use: No    Review of Systems  Unable to perform ROS: Dementia    Allergies  Review of patient's allergies indicates no known allergies.  Home Medications   Current Outpatient Rx  Name  Route  Sig  Dispense  Refill  . atorvastatin (LIPITOR) 10 MG tablet   Oral   Take 10 mg by mouth daily.         . carvedilol (COREG) 12.5 MG tablet   Oral   Take 12.5 mg by mouth 2 (two) times daily with a meal.         . warfarin (COUMADIN) 1 MG tablet   Oral   Take 1 mg by mouth as directed. Take with 4mg  tablet as directed. Total nightly dose is 5mg  on day 1, 5mg  on day 2,  4mg  on day 3. Repeat.         . warfarin (COUMADIN) 4 MG tablet   Oral   Take 4 mg by mouth as directed. Take with 1mg  tablet as directed. Total nightly dose is 5mg  on day 1, 5mg  on day 2, 4mg  on day 3. Repeat          BP 136/91  Pulse 95  Temp(Src) 98.4 F (36.9 C) (Oral)  Resp 29  SpO2 99% Physical Exam  Nursing note and vitals reviewed. Constitutional: He is oriented to person, place, and time. He appears well-developed and well-nourished.  HENT:  Head: Normocephalic and atraumatic.  Right Ear: External ear normal.  Left Ear: External ear normal.  Nose: Nose normal.  Eyes: Right eye exhibits no discharge. Left eye exhibits no discharge.  Neck: Neck supple.  Cardiovascular: Normal rate, regular rhythm, normal heart sounds and intact distal pulses.   Pulmonary/Chest: Effort normal and breath sounds normal.  AICD/pacemaker in upper left chest w/o tenderness or swelling or erythema  Abdominal: Soft. There is no tenderness.  Musculoskeletal: He exhibits edema (bilateral pitting edema in lower extremities).  Neurological: He is alert and oriented to person, place, and time.  Skin: Skin is warm and dry.    ED Course  Procedures (including critical care time) Labs Review Labs Reviewed  CBC - Abnormal; Notable for the following:    Platelets 148 (*)    All other components within normal limits  BASIC METABOLIC PANEL - Abnormal; Notable for the following:    Glucose, Bld 129 (*)    GFR calc non Af Amer 83 (*)    All other components within normal limits  PRO B NATRIURETIC PEPTIDE - Abnormal; Notable for the following:    Pro B Natriuretic peptide (BNP) 3636.0 (*)    All other components within normal limits  PROTIME-INR - Abnormal; Notable for the following:    Prothrombin Time 20.2 (*)    INR 1.78 (*)    All other components within normal limits  MAGNESIUM  POCT I-STAT TROPONIN I   Imaging Review Dg Chest Port 1 View  09/21/2012   *RADIOLOGY REPORT*  Clinical  Data: Shortness of breath  PORTABLE CHEST - 1 VIEW  Comparison: 01/28/2012  Findings: The cardiac shadow is enlarged.  Postsurgical changes are noted.  A defibrillator is noted and stable.  The lungs are clear bilaterally.  IMPRESSION: No acute abnormality noted.   Original Report Authenticated By: [name], [name]     Date: 09/21/2012  Rate: 95  Rhythm: Paced Rhythm  QRS Axis: left  Intervals: normal  ST/T Wave abnormalities: nonspecific ST/T changes  Conduction Disutrbances:none  Narrative Interpretation: Paced rhythm w/ PVCs  Old EKG Reviewed: unchanged   MDM   1. AICD discharge    Interrogation of AICD shows vfib that was shocked as well as terminated VT. Stable here in ED. Discussed with cards, will put in stepdown with close monitoring. No electrolyte abnormalities in ED.     Audree Camel, MD 09/22/12 [phone]

## 2012-09-21 NOTE — ED Notes (Signed)
EDP at bedside, Charge RN at bedside to interrogate AICD

## 2012-09-21 NOTE — ED Notes (Signed)
Pt went into non-sustained v-tach for approx1-2seconds. Pt alert and responsive.

## 2012-09-21 NOTE — H&P (Signed)
Jorge Klein is an 79 y.o. male.   Chief Complaint: ICD firing Primary Cardiologist: Dr. Mayford Knife  HPI: 79 yo man with PMH of T2DM, CAD s/p prior CABG, CVA in December 2013/January 2014, dementia here with ICD firing with interrogation revealing VT/VF. At home he has progressed to requiring transfer by his family 2/2 leg weakness and LUE weakness. He has chronically swollen ankles, takes lasix every other day only because he doesn't like to get up to pee frequently and last saw Dr. Mayford Knife over a year ago. He's not been ill recently, no change in medications (takes lasix every other day and warfarin 3 mg qHS). On further questioning, his daughter tells me he is more SOB than usually. He is also accompanied by his grandson and grandson's girlfriend. His family says he has worsened since his stroke in December 2013/January 2014 hospitalization and he would benefit from a nursing home/facility but this is financially not feasible for the family. He was noted to be shocked twice transferring from the toilet (after sitting for a long time) by his daughter, a small shock and then a big shock. It is hard to determine if he was light-headed beforehand or had other symptoms. No known ongoing chest pain at home. No recent syncope. No previous shocks. ICD placed in 2011 shortly after CABG according to daughter.  Pt received 2 shocks with one failed therapy along with known AT/AF > 6 hrs daily for 292 days. 32 seconds of HR 240 requiring 30J followed by 35J shock.    Past Medical History  Diagnosis Date  . Diabetes mellitus without complication   . CHF (congestive heart failure)   . Stroke   . Gout   . Arthritis   . Ischemic cardiomyopathy     Past Surgical History  Procedure Laterality Date  . Cardiac surgery  04/2009  . Cardiac defibrillator placement  09/23/2009    Medtronic Model [phone] Serial #[id number]    History reviewed. No pertinent family history. Social History:  reports that he has quit  smoking. He does not have any smokeless tobacco history on file. He reports that he does not drink alcohol or use illicit drugs. Family history: no known CAD   Allergies: No Known Allergies Medications reviewed;  No current facility-administered medications for this encounter.     (Not in a hospital admission)  Results for orders placed during the hospital encounter of 09/21/12 (from the past 48 hour(s))  CBC     Status: Abnormal   Collection Time    09/21/12 10:40 PM      Result Value Range   WBC 10.4  4.0 - 10.5 K/uL   RBC 4.92  4.22 - 5.81 MIL/uL   Hemoglobin 16.0  13.0 - 17.0 g/dL   HCT 78.4  69.6 - 29.5 %   MCV 93.9  78.0 - 100.0 fL   MCH 32.5  26.0 - 34.0 pg   MCHC 34.6  30.0 - 36.0 g/dL   RDW 28.4  13.2 - 44.0 %   Platelets 148 (*) 150 - 400 K/uL  PROTIME-INR     Status: Abnormal   Collection Time    09/21/12 10:40 PM      Result Value Range   Prothrombin Time 20.2 (*) 11.6 - 15.2 seconds   INR 1.78 (*) 0.00 - 1.49  POCT I-STAT TROPONIN I     Status: None   Collection Time    09/21/12 10:48 PM      Result Value Range  Troponin i, poc 0.05  0.00 - 0.08 ng/mL   Comment 3            Comment: Due to the release kinetics of cTnI,     a negative result within the first hours     of the onset of symptoms does not rule out     myocardial infarction with certainty.     If myocardial infarction is still suspected,     repeat the test at appropriate intervals.   Dg Chest Port 1 View  09/21/2012   *RADIOLOGY REPORT*  Clinical Data: Shortness of breath  PORTABLE CHEST - 1 VIEW  Comparison: 01/28/2012  Findings: The cardiac shadow is enlarged.  Postsurgical changes are noted.  A defibrillator is noted and stable.  The lungs are clear bilaterally.  IMPRESSION: No acute abnormality noted.   Original Report Authenticated By: [name], [name]    Review of Systems  Constitutional: Positive for malaise/fatigue. Negative for fever, chills and weight loss.  HENT: Positive for  hearing loss. Negative for ear pain.   Eyes: Negative for double vision.  Respiratory: Positive for cough and shortness of breath. Negative for hemoptysis.   Cardiovascular: Positive for leg swelling. Negative for chest pain and palpitations.  Gastrointestinal: Negative for nausea, vomiting, abdominal pain and blood in stool.  Genitourinary: Positive for frequency. Negative for hematuria.  Musculoskeletal: Positive for joint pain. Negative for myalgias.  Neurological: Negative for dizziness, seizures and headaches.  Endo/Heme/Allergies: Negative for environmental allergies and polydipsia.  Psychiatric/Behavioral: Negative for suicidal ideas and substance abuse.    Blood pressure 109/77, pulse 91, temperature 98.3 F (36.8 C), temperature source Oral, resp. rate 23, SpO2 96.00%. Physical Exam  Nursing note and vitals reviewed. Constitutional: He appears well-developed and well-nourished. No distress.  HENT:  Nose: Nose normal.  Mouth/Throat: Oropharynx is clear and moist. No oropharyngeal exudate.  Eyes: Conjunctivae and EOM are normal. Pupils are equal, round, and reactive to light. No scleral icterus.  Neck: Normal range of motion. Neck supple. JVD present.  JVP to just under mandible  Cardiovascular: Normal rate, regular rhythm, normal heart sounds and intact distal pulses.  Exam reveals no gallop.   No murmur heard. Respiratory: Effort normal. No respiratory distress. He has no wheezes. He has rales.  Rales at the bases bilaterally  GI: Soft. Bowel sounds are normal. He exhibits no distension. There is no tenderness. There is no rebound.  Musculoskeletal: Normal range of motion. He exhibits edema. He exhibits no tenderness.  Neurological: He is alert.  Oriented to person Moves all extremities LUE weakness 2+ LEE bilaterally with some chronic skin changes  Skin: Skin is warm. He is diaphoretic. No erythema.    Labs reviewed;  ECG reviewed; HR 90s, mutiple PVCS, LAD with  LBBB ICD interrogation report reviewed; Echo reviewed (report) from 01/14 - LV moderately to severely dilated, EF 25-30% with akinesis of mid-distal anteroseptal wall, trivial AI   Problem List ICD firing for ventricular tachycardia/ventricular fibrillation CAD s/p prior CABG Atrial fibrillation on chronic anticoagulation Hypertension Dyslipidemia Dementia Assessment/Plan 79 yo man with PMH of hypertension, dyslipidemia, T2DM, atrial fibrillation on chronic anticoagulation with warfarin, ICD here with ICD firing. Differential diagnosis is progressive cardiomyopathy, significant volume overload, ischemia, scar among other etiologies. I favor worsening cardiomyopathy with component of volume overload. Will plan to initiate amiodarone, IV lasix. Device interrogation in chart.  - Will load with amiodarone; obtain TFTs, PFTs, LFTs - trend troponins, will need to consider updated ischemic evaluation with lexiscan  vs. LHC  - watch in stepdown/telemetry - discussed long-term s/e of amiodarone with family  - continue warfarin - will likely need dose reduction if continues on amiodarone - IV heparin for INR 1.8 to bridge, pharmacy consult - low dose beta-blocker likely will also be beneficial for VT; will defer initiation until after IV amiodarone given beta-blocking effects of IV amiodarone - update echocardiogram - NPO after MN  Marcele Kosta 09/21/2012, 11:27 PM

## 2012-09-22 ENCOUNTER — Encounter (HOSPITAL_COMMUNITY): Payer: Self-pay | Admitting: *Deleted

## 2012-09-22 ENCOUNTER — Encounter (HOSPITAL_COMMUNITY): Payer: Medicare PPO

## 2012-09-22 LAB — COMPREHENSIVE METABOLIC PANEL
ALT: 15 U/L (ref 0–53)
BUN: 22 mg/dL (ref 6–23)
CO2: 25 mEq/L (ref 19–32)
Calcium: 9.3 mg/dL (ref 8.4–10.5)
Creatinine, Ser: 0.81 mg/dL (ref 0.50–1.35)
GFR calc Af Amer: >90 mL/min (ref >90)
GFR calc non Af Amer: 80 mL/min — ABNORMAL LOW (ref >90)
Glucose, Bld: 131 mg/dL — ABNORMAL HIGH (ref 70–99)
Sodium: 138 mEq/L (ref 135–145)

## 2012-09-22 LAB — PROTIME-INR
INR: 2.08 — ABNORMAL HIGH (ref 0.00–1.49)
Prothrombin Time: 22.7 seconds — ABNORMAL HIGH (ref 11.6–15.2)

## 2012-09-22 LAB — PRO B NATRIURETIC PEPTIDE: Pro B Natriuretic peptide (BNP): 3708 pg/mL — ABNORMAL HIGH (ref 0–450)

## 2012-09-22 LAB — TROPONIN I
Troponin I: <0.3 ng/mL (ref <0.30)
Troponin I: <0.3 ng/mL (ref <0.30)
Troponin I: <0.3 ng/mL (ref <0.30)

## 2012-09-22 LAB — CBC
Hemoglobin: 14.8 g/dL (ref 13.0–17.0)
MCHC: 34.3 g/dL (ref 30.0–36.0)
RDW: 15.1 % (ref 11.5–15.5)
WBC: 8.3 10*3/uL (ref 4.0–10.5)

## 2012-09-22 LAB — HEPARIN LEVEL (UNFRACTIONATED): Heparin Unfractionated: 0.8 IU/mL — ABNORMAL HIGH (ref 0.30–0.70)

## 2012-09-22 LAB — GLUCOSE, CAPILLARY: Glucose-Capillary: 125 mg/dL — ABNORMAL HIGH (ref 70–99)

## 2012-09-22 LAB — TSH: TSH: 1.536 u[IU]/mL (ref 0.350–4.500)

## 2012-09-22 MED ORDER — AMIODARONE LOAD VIA INFUSION
150.0000 mg | Freq: Once | INTRAVENOUS | Status: AC
  Administered 2012-09-22: 150 mg via INTRAVENOUS
  Filled 2012-09-22: qty 83.34

## 2012-09-22 MED ORDER — AMIODARONE HCL IN DEXTROSE 360-4.14 MG/200ML-% IV SOLN
30.0000 mg/h | INTRAVENOUS | Status: DC
  Administered 2012-09-22 – 2012-09-24 (×5): 30 mg/h via INTRAVENOUS
  Filled 2012-09-22 (×12): qty 200

## 2012-09-22 MED ORDER — SODIUM CHLORIDE 0.9 % IV SOLN
250.0000 mL | INTRAVENOUS | Status: DC | PRN
Start: 2012-09-22 — End: 2012-09-28
  Administered 2012-09-22: 10 mL/h via INTRAVENOUS
  Administered 2012-09-23: 1000 mL via INTRAVENOUS

## 2012-09-22 MED ORDER — HEPARIN (PORCINE) IN NACL 100-0.45 UNIT/ML-% IJ SOLN
1350.0000 [IU]/h | INTRAMUSCULAR | Status: DC
  Administered 2012-09-22 (×2): 1350 [IU]/h via INTRAVENOUS
  Administered 2012-09-22: 1250 [IU]/h via INTRAVENOUS
  Filled 2012-09-22 (×2): qty 250

## 2012-09-22 MED ORDER — ACETAMINOPHEN 325 MG PO TABS: 650.0000 mg | ORAL_TABLET | ORAL | Status: DC | PRN

## 2012-09-22 MED ORDER — FUROSEMIDE 10 MG/ML IJ SOLN
40.0000 mg | Freq: Once | INTRAMUSCULAR | Status: AC
  Administered 2012-09-22: 40 mg via INTRAVENOUS
  Filled 2012-09-22: qty 4

## 2012-09-22 MED ORDER — ASPIRIN 81 MG PO CHEW
324.0000 mg | CHEWABLE_TABLET | ORAL | Status: AC
  Administered 2012-09-23: 324 mg via ORAL
  Filled 2012-09-22: qty 4

## 2012-09-22 MED ORDER — SODIUM CHLORIDE 0.9 % IJ SOLN: 3.0000 mL | INTRAMUSCULAR | Status: DC | PRN

## 2012-09-22 MED ORDER — ONDANSETRON HCL 4 MG/2ML IJ SOLN: 4.0000 mg | Freq: Four times a day (QID) | INTRAMUSCULAR | Status: DC | PRN

## 2012-09-22 MED ORDER — WARFARIN SODIUM 3 MG PO TABS
3.0000 mg | ORAL_TABLET | Freq: Every day | ORAL | Status: DC
  Filled 2012-09-22 (×2): qty 1

## 2012-09-22 MED ORDER — SODIUM CHLORIDE 0.9 % IV SOLN: INTRAVENOUS | Status: DC

## 2012-09-22 MED ORDER — VITAMIN K1 10 MG/ML IJ SOLN
2.0000 mg | Freq: Once | INTRAMUSCULAR | Status: AC
  Administered 2012-09-22: 2 mg via SUBCUTANEOUS
  Filled 2012-09-22: qty 0.2

## 2012-09-22 MED ORDER — HEPARIN (PORCINE) IN NACL 100-0.45 UNIT/ML-% IJ SOLN
1150.0000 [IU]/h | INTRAMUSCULAR | Status: DC
  Administered 2012-09-23 – 2012-09-27 (×5): 1150 [IU]/h via INTRAVENOUS
  Filled 2012-09-22 (×9): qty 250

## 2012-09-22 MED ORDER — WARFARIN - PHYSICIAN DOSING INPATIENT: Freq: Every day | Status: DC

## 2012-09-22 MED ORDER — AMIODARONE HCL IN DEXTROSE 360-4.14 MG/200ML-% IV SOLN
60.0000 mg/h | INTRAVENOUS | Status: AC
  Administered 2012-09-22: 60 mg/h via INTRAVENOUS

## 2012-09-22 MED ORDER — SODIUM CHLORIDE 0.9 % IJ SOLN
3.0000 mL | Freq: Two times a day (BID) | INTRAMUSCULAR | Status: DC
  Administered 2012-09-22 – 2012-09-28 (×5): 3 mL via INTRAVENOUS

## 2012-09-22 MED ORDER — ASPIRIN EC 81 MG PO TBEC
81.0000 mg | DELAYED_RELEASE_TABLET | Freq: Every day | ORAL | Status: DC
  Administered 2012-09-22 – 2012-09-24 (×3): 81 mg via ORAL
  Filled 2012-09-22 (×3): qty 1

## 2012-09-22 NOTE — Progress Notes (Signed)
  Echocardiogram 2D Echocardiogram has been performed.  Garrick Midgley FRANCES 09/22/2012, 12:06 PM

## 2012-09-22 NOTE — Progress Notes (Signed)
ANTICOAGULATION CONSULT NOTE - Follow Up Consult  Pharmacy Consult for Heparin  Indication: Hx Afib while warfarin on hold  No Known Allergies  Patient Measurements: Height: 6\' 3"  (190.5 cm) Weight: 241 lb 2.9 oz (109.4 kg) IBW/kg (Calculated) : 84.5 Heparin Dosing Weight: 106.8 kg  Vital Signs: Temp: 97.5 F (36.4 C) (08/28 0801) Temp src: Oral (08/28 0801) BP: 99/71 mmHg (08/28 1000) Pulse Rate: 63 (08/28 1000)  Labs:  Recent Labs  09/21/12 2240 09/22/12 0100 09/22/12 0810 09/22/12 0920  HGB 16.0  --   --  14.8  HCT 46.2  --   --  43.1  PLT 148*  --   --  158  APTT  --  39*  --   --   LABPROT 20.2*  --   --  22.7*  INR 1.78*  --   --  2.08*  HEPARINUNFRC  --   --   --  0.27*  CREATININE 0.75  --  0.81  --   TROPONINI  --  <0.30 <0.30  --     Estimated Creatinine Clearance: 92.4 ml/min (by C-G formula based on Cr of 0.81).   Medications:  Heparin @ 1250 units/hr (12.5 ml/hr)  Assessment: 79 y.o. M who presented on 8/28 with ICD firing and was started on a heparin bridge while the INR was <2 for hx Afib. The patient had already taken his warfarin dose on 8/28 evening and was also initiated on an amiodarone drip. Both of which have resulted in an INR elevation within the therapeutic range today (INR 2.08 << 1.78). Noted plans to hold warfarin to undergo cardiac cath, also given Vit K SQ this afternoon -- so will continue heparin bridge for now as INR will being trending down.  Heparin level this morning is slightly SUBtherapeutic (HL 0.27, goal of 0.3-0.7) however was drawn ~1 hour early from the patient's steady state. Will increase heparin drip rate slightly and f/u with another 8 hr level.   Goal of Therapy:  Heparin level 0.3-0.7 units/ml Monitor platelets by anticoagulation protocol: Yes   Plan:  1. Increase heparin drip rate to 1350 units/hr (13.5 ml/hr) 2. Will continue to monitor for any signs/symptoms of bleeding and will follow up with heparin level in  8 hours   Georgina Pillion, PharmD, BCPS Clinical Pharmacist Pager: [phone] 09/22/2012 11:47 AM

## 2012-09-22 NOTE — Progress Notes (Signed)
SUBJECTIVE:  No complaints this am.  No further arrhythmias on IV Amio  OBJECTIVE:   Vitals:   Filed Vitals:   09/22/12 0600 09/22/12 0700 09/22/12 0800 09/22/12 0801  BP: 120/90 108/66 113/82   Pulse: 88  78 76  Temp:    97.5 F (36.4 C)  TempSrc:    Oral  Resp: 19 22 23    Height:      Weight:      SpO2: 93%  100% 100%   I&O's:   Intake/Output Summary (Last 24 hours) at 09/22/12 1610 Last data filed at 09/22/12 0800  Gross per 24 hour  Intake 430.47 ml  Output    725 ml  Net -294.53 ml   TELEMETRY: Reviewed telemetry pt in NSR with PVC's:     PHYSICAL EXAM General: Well developed, well nourished, in no acute distress Head: Eyes PERRLA, No xanthomas.   Normal cephalic and atramatic  Lungs:   Clear bilaterally to auscultation and percussion. Heart:   HRRR S1 S2 Pulses are 2+ & equal. Abdomen: Bowel sounds are positive, abdomen soft and non-tender without masses  Extremities:   No clubbing, cyanosis or edema.  DP +1 Neuro: Alert and oriented X 3. Psych:  Good affect, responds appropriately   LABS: Basic Metabolic Panel:  Recent Labs  96/04/54 2240 09/22/12 0100 09/22/12 0810  NA 135  --  138  K 4.2  --  3.9  CL 100  --  101  CO2 22  --  25  GLUCOSE 129*  --  131*  BUN 22  --  22  CREATININE 0.75  --  0.81  CALCIUM 9.9  --  9.3  MG 2.1 2.1  --    Liver Function Tests:  Recent Labs  09/22/12 0810  AST 26  ALT 15  ALKPHOS 73  BILITOT 1.5*  PROT 6.6  ALBUMIN 3.4*   No results found for this basename: LIPASE, AMYLASE,  in the last 72 hours CBC:  Recent Labs  09/21/12 2240  WBC 10.4  HGB 16.0  HCT 46.2  MCV 93.9  PLT 148*   Cardiac Enzymes:  Recent Labs  09/22/12 0100 09/22/12 0810  TROPONINI <0.30 <0.30   Coag Panel:   Lab Results  Component Value Date   INR 1.78* 09/21/2012   INR 2.11* 02/01/2012   INR 2.14* 01/31/2012    RADIOLOGY: Dg Chest Port 1 View  09/21/2012   *RADIOLOGY REPORT*  Clinical Data: Shortness of breath   PORTABLE CHEST - 1 VIEW  Comparison: 01/28/2012  Findings: The cardiac shadow is enlarged.  Postsurgical changes are noted.  A defibrillator is noted and stable.  The lungs are clear bilaterally.  IMPRESSION: No acute abnormality noted.   Original Report Authenticated By: [name], [name]   Problem List  ICD firing for ventricular tachycardia/ventricular fibrillation  CAD s/p prior CABG  Atrial fibrillation on chronic anticoagulation  Hypertension  Dyslipidemia  Dementia  Assessment/Plan  79 yo man with PMH of hypertension, dyslipidemia, T2DM, atrial fibrillation on chronic anticoagulation with warfarin, ICD here with ICD firing. Differential diagnosis is progressive cardiomyopathy, significant volume overload, ischemia, scar among other etiologies.  - Will continue IV miodarone; obtain TFTs, PFTs, LFTs  - Recheck INR this am and if ok will plan cath to rule out progression of CAD - watch in stepdown/telemetry  - hold warfarin until cath completed - IV heparin for INR 1.8 to bridge - update echocardiogram  - NPO after MN - hold beta blocker for now  due to hypotension on admit    Quintella Reichert, MD  09/22/2012  9:52 AM

## 2012-09-22 NOTE — Progress Notes (Signed)
ANTICOAGULATION CONSULT NOTE - Follow Up Consult  Pharmacy Consult for Heparin  Indication: Hx Afib while warfarin on hold  No Known Allergies  Patient Measurements: Height: 6\' 3"  (190.5 cm) Weight: 241 lb 2.9 oz (109.4 kg) IBW/kg (Calculated) : 84.5 Heparin Dosing Weight: 106.8 kg  Vital Signs: Temp: 98.6 F (37 C) (08/28 2000) Temp src: Oral (08/28 2000) BP: 110/79 mmHg (08/28 2000) Pulse Rate: 82 (08/28 2000)  Labs:  Recent Labs  09/21/12 2240 09/22/12 0100 09/22/12 0810 09/22/12 0920 09/22/12 1438 09/22/12 2000  HGB 16.0  --   --  14.8  --   --   HCT 46.2  --   --  43.1  --   --   PLT 148*  --   --  158  --   --   APTT  --  39*  --   --   --   --   LABPROT 20.2*  --   --  22.7*  --   --   INR 1.78*  --   --  2.08*  --   --   HEPARINUNFRC  --   --   --  0.27*  --  0.80*  CREATININE 0.75  --  0.81  --   --   --   TROPONINI  --  <0.30 <0.30  --  <0.30  --     Estimated Creatinine Clearance: 92.4 ml/min (by C-G formula based on Cr of 0.81).   Medications:  Heparin @ 1250 units/hr (12.5 ml/hr)  Assessment: 79 y.o. M who presented on 8/28 with ICD firing and was started on a heparin bridge while the INR was <2 for hx Afib. The patient had already taken his warfarin dose on 8/28 evening and was also initiated on an amiodarone drip. Both of which have resulted in an INR elevation within the therapeutic range today (INR 2.08 << 1.78). Noted plans to hold warfarin to undergo cardiac cath, also given Vit K SQ this afternoon -- so will continue heparin bridge for now as INR will being trending down.  Heparin level this morning is slightly SUBtherapeutic (HL 0.27, goal of 0.3-0.7) however was drawn ~1 hour early from the patient's steady state. Rate was increased by 100 units/hr and now evening HL was supratherapeutic at 0.8. D/w Rn and lab drawn on same arm as heparin but distal to infusion. No bleeding issues noted. No issues with infusion per RN.  Goal of Therapy:   Heparin level 0.3-0.7 units/ml Monitor platelets by anticoagulation protocol: Yes   Plan:  1. Decrease heparin drip rate to 1150 units/hr (11.5 ml/hr) 2. Will continue to monitor for any signs/symptoms of bleeding and will follow up with heparin level in 8 hours   Sheppard Coil PharmD., BCPS Clinical Pharmacist Pager [phone] 09/22/2012 9:51 PM

## 2012-09-22 NOTE — Care Management Note (Addendum)
  Page 2 of 2   09/27/2012     10:57:34 AM   CARE MANAGEMENT NOTE 09/27/2012  Patient:  Jorge Klein, Jorge Klein   Account Number:  [id number]  Date Initiated:  09/22/2012  Documentation initiated by:  Junius Creamer  Subjective/Objective Assessment:   adm w v tach     Action/Plan:   lives w fam, pcp dr hal stoneking   Anticipated DC Date:     Anticipated DC Plan:    In-house referral  Clinical Social Worker  Hospice / Palliative Care      DC Planning Services  CM consult      PAC Choice  HOSPICE  DURABLE MEDICAL EQUIPMENT   Choice offered to / List presented to:  C-4 Adult Children   DME arranged  3-N-1  HOSPITAL BED  OXYGEN  OVERBED TABLE  WHEELCHAIR - MANUAL      DME agency  [facility]     HH arranged  HH-1 RN  HH-10 DISEASE MANAGEMENT      HH agency  [facility] [place]   Status of service:  Completed, signed off Medicare Important Message given?   (If response is "NO", the following Medicare IM given date fields will be blank) Date Medicare IM given:   Date Additional Medicare IM given:    Discharge Disposition:  HOME W HOSPICE CARE  Per UR Regulation:  Reviewed for med. necessity/level of care/duration of stay  If discussed at Long Length of Stay Meetings, dates discussed:    Comments:  09-27-12 Tomi Bamberger, RN, [phone] CM did speak to daughter Tamela Oddi this am and she has chosen [facility]. CM did call Margie Liaison with [facility] for referral. Had to leave a voice mail. CM discssed with daughter that since DME is via [employer] she may have to switch all DME out in order for [facility] to deliver. Daughter really wants pt to be d/c today. She did have some concerns with his anxiety and when d/c wants him on anxiety medications. She wants him to continue coumadin also. CM did call [facility] in reference to dme and CM will place packages B and D in Epic for DME to be covered under [facility]osp[facility] Benefits. CM did place a call to MD Turner  to make her aware of plan. Per MD Turner pt to stay overnight and monitor labs and for d/c in am.  CM will notify RN Inetta Fermo of plan of care. No further needs form CM at this time.      8/28 1020 debbie dowell rn,bsn per md progress note fam would like snf but have been unable to afford long term. will ask sw to see. will follow for dc needs as pt progresses. pt does have several adm to snf from hospital in past.

## 2012-09-22 NOTE — ED Notes (Signed)
Pt's belongings given to daughter. Blanket and gait belt in bed with patient

## 2012-09-22 NOTE — Progress Notes (Signed)
ANTICOAGULATION CONSULT NOTE - Initial Consult  Pharmacy Consult for heparin  Indication: bridge to tx inr with icd firing  No Known Allergies  Patient Measurements: Height: 6\' 3"  (190.5 cm) Weight: 241 lb 2.9 oz (109.4 kg) IBW/kg (Calculated) : 84.5 Heparin Dosing Weight:   Vital Signs: Temp: 97.9 F (36.6 C) (08/28 0040) Temp src: Oral (08/28 0040) BP: 99/59 mmHg (08/28 0100) Pulse Rate: 85 (08/28 0100)  Labs:  Recent Labs  09/21/12 2240  HGB 16.0  HCT 46.2  PLT 148*  LABPROT 20.2*  INR 1.78*  CREATININE 0.75    Estimated Creatinine Clearance: 93.5 ml/min (by C-G formula based on Cr of 0.75).   Medical History: Past Medical History  Diagnosis Date  . CHF (congestive heart failure)   . Stroke   . Gout   . Arthritis   . Ischemic cardiomyopathy   . Myocardial infarction   . Dementia     has not been diagnosed per family    Medications:  Prescriptions prior to admission  Medication Sig Dispense Refill  . warfarin (COUMADIN) 3 MG tablet Take 3 mg by mouth daily.        Assessment: AICD is firing and inr <2. (starting on amio so dose may need adjusting) heparin to therapeutic inr with no bolus. Coumadin dosed by MD at this point.  Goal of Therapy:  INR 2-3 Heparin 0.3-0.7    Plan:  Begin heparin at 1250 units/hr with no bolus. Heparin level in 8 hours.  -daily HL and CBC starting 8/29 Daily INR f/u   Janice Coffin 09/22/2012,1:07 AM

## 2012-09-23 ENCOUNTER — Encounter (HOSPITAL_COMMUNITY)
Admission: EM | Disposition: A | Discharge status: Hospice | Payer: Self-pay | Source: Home / Self Care | Attending: Cardiology

## 2012-09-23 DIAGNOSIS — R531 Weakness: Secondary | ICD-10-CM

## 2012-09-23 DIAGNOSIS — R5381 Other malaise: Secondary | ICD-10-CM

## 2012-09-23 DIAGNOSIS — R0600 Dyspnea, unspecified: Secondary | ICD-10-CM

## 2012-09-23 DIAGNOSIS — R0609 Other forms of dyspnea: Secondary | ICD-10-CM

## 2012-09-23 DIAGNOSIS — Z515 Encounter for palliative care: Secondary | ICD-10-CM

## 2012-09-23 LAB — CBC
HCT: 43 % (ref 39.0–52.0)
Hemoglobin: 14.7 g/dL (ref 13.0–17.0)
MCV: 93.5 fL (ref 78.0–100.0)
RBC: 4.6 MIL/uL (ref 4.22–5.81)
WBC: 7.2 10*3/uL (ref 4.0–10.5)

## 2012-09-23 LAB — BASIC METABOLIC PANEL
CO2: 20 mEq/L (ref 19–32)
Chloride: 101 mEq/L (ref 96–112)
GFR calc Af Amer: >90 mL/min (ref >90)
Potassium: 3.6 mEq/L (ref 3.5–5.1)

## 2012-09-23 LAB — PROTIME-INR: INR: 1.76 — ABNORMAL HIGH (ref 0.00–1.49)

## 2012-09-23 LAB — HEPARIN LEVEL (UNFRACTIONATED): Heparin Unfractionated: 0.61 IU/mL (ref 0.30–0.70)

## 2012-09-23 SURGERY — LEFT HEART CATHETERIZATION WITH CORONARY/GRAFT ANGIOGRAM
Anesthesia: LOCAL

## 2012-09-23 MED ORDER — NITROGLYCERIN 0.4 MG SL SUBL
SUBLINGUAL_TABLET | SUBLINGUAL | Status: AC
  Filled 2012-09-23: qty 25

## 2012-09-23 MED ORDER — WARFARIN SODIUM 5 MG PO TABS
5.0000 mg | ORAL_TABLET | Freq: Once | ORAL | Status: AC
  Administered 2012-09-23: 5 mg via ORAL
  Filled 2012-09-23: qty 1

## 2012-09-23 MED ORDER — MORPHINE SULFATE 2 MG/ML IJ SOLN: 2.0000 mg | INTRAMUSCULAR | Status: DC | PRN

## 2012-09-23 MED ORDER — WARFARIN - PHARMACIST DOSING INPATIENT
Freq: Every day | Status: DC
  Administered 2012-09-27: 18:00:00

## 2012-09-23 MED ORDER — SODIUM CHLORIDE 0.9 % IV SOLN
INTRAVENOUS | Status: DC
  Administered 2012-09-25: 23:00:00 via INTRAVENOUS

## 2012-09-23 NOTE — Consult Note (Signed)
Jorge Klein Jorge Klein      DOB: 20-Mar-1927      VWU:[id number]     Consult Note from the Palliative Medicine Team at [facility]    Consult Requested by: Dr Anne Fu     PCP: Ginette Otto, MD Reason for Consultation:Clarification of GOC and options     Phone Number:[phone]  Assessment of patients Current state: 79 yo man with PMH of hypertension, dyslipidemia, T2DM, atrial fibrillation on chronic anticoagulation with warfarin, ICD here with ICD firing. Differential diagnosis is progressive cardiomyopathy, significant volume overload, ischemia, scar among other etiologies.  Per family continued cognitive decline  Worsening cardiomyopathy, now on IV amiodarone.  Not a candidate for cardiac cath.  Family faced with medical decisions and care needs.  Presently lives at home with daughter and grandson as main caregivers   Consult is for review of medical treatment options, clarification of goals of care and end of life issues, disposition and options, and symptom recommendation.  This NP Lorinda Creed reviewed medical records, received report from team, assessed the Jorge and then meet at the Jorge's bedside along with his family to include his wife, son Jamarques Pinedo # [phone], daughter Jorge Klein # [phone]  to discuss diagnosis prognosis, GOC, EOL wishes disposition and options.   A detailed discussion was had today regarding advanced directives.  Concepts specific to code status, artifical feeding and hydration, continued IV antibiotics and rehospitalization was had. Specific conversation to continued use of ICD device; risks and benefits    The difference between a aggressive medical intervention path  and a palliative comfort care path for this Jorge at this time was had.  Values and goals of care important to Jorge and family were attempted to be elicited.  Concept of Hospice and Palliative Care were discussed  Natural trajectory and expectations at EOL were  discussed.  Questions and concerns addressed.  Family encouraged to call with questions or concerns.  PMT will continue to support holistically.      Goals of Care: 1.  Code Status:  Limited  -no CPR, compressions, intubation  -deactivate ICD- ( I personally spoke to the [employer] rep and she Jana Half) tells me she will be in to see the Jorge this evening.  Order written as one time nursing order   2. Scope of Treatment: 1. Vital Signs: per unit 2. AICD: deactivate  3. Respiratory/Oxygen: as needed 4. Nutritional Support/Tube Feeds: no artificial feeding now or in the future 5. Antibiotics: yes 6. IVF: as needed 7. Labs:yes 8. Telemetry:yes   4. Disposition:  Leaning toward home with with hospice when medically sable   3. Symptom Management:   1. Pain/Dyspnea: Morphine 2 mg IV every 3 hrs prn for anticipatory needs  4. Psychosocial:  Emotional support to family.  This is a difficult siutation complicated by his wife's  Dementia diagnosis.  All understand the overall poor prognosis.    Brief HPI: 79 yo man with PMH of hypertension, dyslipidemia, T2DM, atrial fibrillation on chronic anticoagulation with warfarin, ICD here with ICD firing. Differential diagnosis is progressive cardiomyopathy, significant volume overload, ischemia, scar among other etiologies.  Per family continued cognitive decline  Worsening cardiomyopathy, now on IV amiodarone.  Not a candidate for cardiac cath.  Family faced with medical decisions and care needs    ROS: no complaints voiced presently     PMH:  Past Medical History  Diagnosis Date  . CHF (congestive heart failure)   . Stroke   . Gout   .  Arthritis   . Ischemic cardiomyopathy   . Myocardial infarction   . Dementia     has not been diagnosed per family     PSH: Past Surgical History  Procedure Laterality Date  . Cardiac surgery  04/2009  . Cardiac defibrillator placement  09/23/2009    [employer] Model [phone] Serial  #[id number]   I have reviewed the FH and SH and  If appropriate update it with new information. No Known Allergies Scheduled Meds: . aspirin EC  81 mg Oral Daily  . sodium chloride  3 mL Intravenous Q12H  . warfarin  5 mg Oral ONCE-1800  . Warfarin - Pharmacist Dosing Inpatient   Does not apply q1800   Continuous Infusions: . sodium chloride 10 mL/hr at 09/23/12 0926  . amiodarone (NEXTERONE PREMIX) 360 mg/200 mL dextrose 30 mg/hr (09/23/12 1236)  . heparin 1,150 Units/hr (09/23/12 0600)   PRN Meds:.sodium chloride, acetaminophen, ondansetron (ZOFRAN) IV, sodium chloride    BP 101/63  Pulse 59  Temp(Src) 97.9 F (36.6 C) (Oral)  Resp 15  Ht 6\' 3"  (1.905 m)  Wt 108.9 kg (240 lb 1.3 oz)  BMI 30.01 kg/m2  SpO2 100%   PPS: 40 %   Intake/Output Summary (Last 24 hours) at 09/23/12 1503 Last data filed at 09/23/12 1245  Gross per 24 hour  Intake 1259.96 ml  Output    501 ml  Net 758.96 ml    Physical Exam:  General: chronically ill appearing, NAD HEENT:  Mm, no exudate Chest:   Diminished in bases  CTA CVS: bradycardic Abdomen: soft NT +BS Ext: without edema Neuro: pleasantly confused  Labs: CBC    Component Value Date/Time   WBC 7.2 09/23/2012 0630   RBC 4.60 09/23/2012 0630   HGB 14.7 09/23/2012 0630   HCT 43.0 09/23/2012 0630   PLT 113* 09/23/2012 0630   MCV 93.5 09/23/2012 0630   MCH 32.0 09/23/2012 0630   MCHC 34.2 09/23/2012 0630   RDW 14.9 09/23/2012 0630   LYMPHSABS 1.0 01/28/2012 1636   MONOABS 0.7 01/28/2012 1636   EOSABS 0.2 01/28/2012 1636   BASOSABS 0.0 01/28/2012 1636    BMET    Component Value Date/Time   NA 137 09/23/2012 0630   K 3.6 09/23/2012 0630   CL 101 09/23/2012 0630   CO2 20 09/23/2012 0630   GLUCOSE 108* 09/23/2012 0630   BUN 24* 09/23/2012 0630   CREATININE 0.84 09/23/2012 0630   CALCIUM 9.1 09/23/2012 0630   GFRNONAA 79* 09/23/2012 0630   GFRAA >90 09/23/2012 0630    CMP     Component Value Date/Time   NA 137 09/23/2012 0630   K 3.6  09/23/2012 0630   CL 101 09/23/2012 0630   CO2 20 09/23/2012 0630   GLUCOSE 108* 09/23/2012 0630   BUN 24* 09/23/2012 0630   CREATININE 0.84 09/23/2012 0630   CALCIUM 9.1 09/23/2012 0630   PROT 6.6 09/22/2012 0810   ALBUMIN 3.4* 09/22/2012 0810   AST 26 09/22/2012 0810   ALT 15 09/22/2012 0810   ALKPHOS 73 09/22/2012 0810   BILITOT 1.5* 09/22/2012 0810   GFRNONAA 79* 09/23/2012 0630   GFRAA >90 09/23/2012 0630     Time In Time Out Total Time Spent with Jorge Total Overall Time  1345 1545 100 min 120 min    Greater than 50%  of this time was spent counseling and coordinating care related to the above assessment and plan.  Lorinda Creed NP  Palliative Medicine Team Team Phone #  [phone] Pager [phone]  Discussed with Dr Anne Fu

## 2012-09-23 NOTE — Progress Notes (Signed)
Spoke to [employer])  Agree with ICD disabled Limited code  - no CPR  - no intubation  - no shock  Ok with IV amio if needed.   Will change to PO amio in AM  Family understands poor prognosis Thinking over weekend about home hospice.

## 2012-09-23 NOTE — Progress Notes (Signed)
Thank you for consulting the Palliative Medicine Team at [facility] to meet your patient's and family's needs.   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 and had trouble with the hospital layout because he needed to go to the bathroom- c/o need to have a bowel movement and then stated 'I just want to get back to normal' spoke about 'my father Merlyn Albert' who will be helping him - Clinical research associate spoke with staff RN Cathlean Cower who confirms patient has been confused and she will f/u on need for [facility] Spoke with son Merlyn Albert, who is agreeable to meeting time today at 2 pm but needs to confirm with his sister and mother; Merlyn Albert indicated his mother was also having some memory issues but did want her to be present for discussion Merlyn Albert will call back to PMT phone to confirm meeting time within the hour  Valente David, RN 09/23/2012, 10:27 AM Palliative Medicine Team RN Liaison [phone]

## 2012-09-23 NOTE — Progress Notes (Addendum)
ANTICOAGULATION CONSULT NOTE - Follow Up Consult  Pharmacy Consult for Heparin  Indication: Hx Afib while warfarin on hold  No Known Allergies  Patient Measurements: Height: 6\' 3"  (190.5 cm) Weight: 240 lb 1.3 oz (108.9 kg) IBW/kg (Calculated) : 84.5 Heparin Dosing Weight: 106.8 kg  Vital Signs: Temp: 97.4 F (36.3 C) (08/29 0759) Temp src: Oral (08/29 0759) BP: 89/58 mmHg (08/29 0800) Pulse Rate: 50 (08/29 0800)  Labs:  Recent Labs  09/21/12 2240 09/22/12 0100 09/22/12 0810 09/22/12 0920 09/22/12 1438 09/22/12 2000 09/23/12 0630  HGB 16.0  --   --  14.8  --   --  14.7  HCT 46.2  --   --  43.1  --   --  43.0  PLT 148*  --   --  158  --   --  113*  APTT  --  39*  --   --   --   --   --   LABPROT 20.2*  --   --  22.7*  --   --  20.0*  INR 1.78*  --   --  2.08*  --   --  1.76*  HEPARINUNFRC  --   --   --  0.27*  --  0.80* 0.41  CREATININE 0.75  --  0.81  --   --   --  0.84  TROPONINI  --  <0.30 <0.30  --  <0.30  --   --     Estimated Creatinine Clearance: 88.9 ml/min (by C-G formula based on Cr of 0.84).   Medications:  Heparin @ 1250 units/hr (12.5 ml/hr)  Assessment: 79 y.o. M who presented on 8/28 with ICD firing and was started on a heparin bridge while the INR was <2 for hx Afib. Noted plans to hold warfarin for cardiac cath s/p Vit K SQ 8/28. The heparin level is 0.41 and at goal, INR today=1.76.  Goal of Therapy:  Heparin level 0.3-0.7 units/ml Monitor platelets by anticoagulation protocol: Yes   Plan:  -No heparin changes -Will follow plans for cath -Will recheck a heparin level later today if no cath today  Harland German, Pharm D 09/23/2012 9:39 AM  Addendum: Coumadin -Cath cancelled today and coumadin resumed -Home coumadin dose 3mg /day (last dose 09/22/11) -Patient noted on IV amiodarone but also noted s/p vitamin K  Plan -Coumadin 5mg  po today -Daily P/INR  Harland German, Pharm D 09/23/2012 9:59 AM   Addendum: heparin -repeat heparin  level= 0.61 (at goal)  Plan -No heparin changes needed -Daily heparin level and CBC  Harland German, Pharm D 09/23/2012 2:30 PM

## 2012-09-23 NOTE — Progress Notes (Signed)
Pharmacist Heart Failure Core Measure Documentation  Assessment: Jorge Klein has an EF documented as 10% - 20% on 09/22/2012 by Echo.  Rationale: Heart failure patients with left ventricular systolic dysfunction (LVSD) and an EF < 40% should be prescribed an angiotensin converting enzyme inhibitor (ACEI) or angiotensin receptor blocker (ARB) at discharge unless a contraindication is documented in the medical record.  This patient is not currently on an ACEI or ARB for HF.  This note is being placed in the record in order to provide documentation that a contraindication to the use of these agents is present for this encounter.  ACE Inhibitor or Angiotensin Receptor Blocker is contraindicated (specify all that apply)  []   ACEI allergy AND ARB allergy []   Angioedema []   Moderate or severe aortic stenosis []   Hyperkalemia [x]   Hypotension []   Renal artery stenosis []   Worsening renal function, preexisting renal disease or dysfunction   Harland German, Pharm D 09/23/2012 3:06 PM   e

## 2012-09-23 NOTE — Progress Notes (Signed)
Subjective:  Sleeping in bed, mouth agape. Easily arousable. Confused about his location. Since his daughter is upstairs. No chest pain, no shortness of breath.  Objective:  Vital Signs in the last 24 hours: Temp:  [97.4 F (36.3 C)-98.6 F (37 C)] 97.4 F (36.3 C) (08/29 0759) Pulse Rate:  [47-82] 47 (08/29 1000) Resp:  [13-26] 14 (08/29 1000) BP: (69-110)/(50-84) 69/50 mmHg (08/29 1000) SpO2:  [96 %-100 %] 99 % (08/29 1000) Weight:  [108.9 kg (240 lb 1.3 oz)-109.4 kg (241 lb 2.9 oz)] 108.9 kg (240 lb 1.3 oz) (08/29 0405)  Intake/Output from previous day: 08/28 0701 - 08/29 0700 In: 1689.8 [P.O.:585; I.V.:1104.8] Out: 825 [Urine:825]   Physical Exam: General: Elderly, in no acute distress. Head:  Normocephalic and atraumatic. Lungs: Clear to auscultation and percussion. Heart: Normal S1 and S2.  No murmur, rubs or gallops.  Abdomen: soft, non-tender, positive bowel sounds. Extremities: No clubbing or cyanosis. No edema. Neurologic: Alert confused    Lab Results:  Recent Labs  09/22/12 0920 09/23/12 0630  WBC 8.3 7.2  HGB 14.8 14.7  PLT 158 113*    Recent Labs  09/22/12 0810 09/23/12 0630  NA 138 137  K 3.9 3.6  CL 101 101  CO2 25 20  GLUCOSE 131* 108*  BUN 22 24*  CREATININE 0.81 0.84    Recent Labs  09/22/12 0810 09/22/12 1438  TROPONINI <0.30 <0.30   Hepatic Function Panel  Recent Labs  09/22/12 0810  PROT 6.6  ALBUMIN 3.4*  AST 26  ALT 15  ALKPHOS 73  BILITOT 1.5*    Imaging: Dg Chest Port 1 View  09/21/2012   *RADIOLOGY REPORT*  Clinical Data: Shortness of breath  PORTABLE CHEST - 1 VIEW  Comparison: 01/28/2012  Findings: The cardiac shadow is enlarged.  Postsurgical changes are noted.  A defibrillator is noted and stable.  The lungs are clear bilaterally.  IMPRESSION: No acute abnormality noted.   Original Report Authenticated By: [name], [name]   Personally viewed.   Telemetry: No further VT Personally viewed.   Cardiac  Studies:  EF 10%, diffuse hypokinesis  Assessment/Plan:  Principal Problem:   VT (ventricular tachycardia) Active Problems:   HTN (hypertension)   History of CVA (cerebrovascular accident)   A-fib   Diabetes   Ischemic cardiomyopathy  79 year old male with ischemic cardiomyopathy, worsening left ventricular systolic function with ejection fraction of 10% with admissions secondary to ventricular fibrillation/ventricular tachycardia with ICD discharge on chronic anticoagulation, A. fib, diabetes, history of 2 strokes most recently in January of 2014, dementia.   - Plan originally was to perform cardiac catheterization however after further objective evidence from echocardiogram demonstrating severe global hypokinesis with ejection fraction of 10% as well as underlying dementia/delirium and truly overall grave prognosis I've had a lengthy discussion with Merlyn Albert, his son and we have mutually decided to not proceed with cardiac catheterization.   - My discussion with Merlyn Albert included overall poor prognosis, high six-month mortality. He understands. He states that his father had a very difficult time with anesthesia after CABG. He has had difficulty at home taking care of his father, his daughter Tamela Oddi over the last 6 months has been overwhelmed.  - I have contacted the palliative care team to have further discussion about possibilities such as home hospice.  - His son also brought up the possibility of disabling his defibrillator. He did state that his father does not remember having the shock or the discharge, and he does have a high  pain threshold however given the state of his overall ejection fraction, it is likely that he will have another episode of ventricular tachycardia or ventricular fibrillation. IV amiodarone has been helping. We will continue. Given his overall inability to cooperate with pulmonary function studies, we have canceled.  - I have continued/restarted his Coumadin.  Further  family discussion later on this afternoon with palliative care team.     Anne Fu, Loraine Leriche 09/23/2012, 11:34 AM

## 2012-09-24 DIAGNOSIS — Z0389 Encounter for observation for other suspected diseases and conditions ruled out: Secondary | ICD-10-CM

## 2012-09-24 DIAGNOSIS — F0390 Unspecified dementia without behavioral disturbance: Secondary | ICD-10-CM

## 2012-09-24 DIAGNOSIS — I2589 Other forms of chronic ischemic heart disease: Secondary | ICD-10-CM

## 2012-09-24 DIAGNOSIS — I4891 Unspecified atrial fibrillation: Secondary | ICD-10-CM

## 2012-09-24 LAB — PROTIME-INR
INR: 1.4 (ref 0.00–1.49)
Prothrombin Time: 16.8 seconds — ABNORMAL HIGH (ref 11.6–15.2)

## 2012-09-24 LAB — BASIC METABOLIC PANEL
Chloride: 101 mEq/L (ref 96–112)
Creatinine, Ser: 0.8 mg/dL (ref 0.50–1.35)
GFR calc Af Amer: >90 mL/min (ref >90)
Potassium: 3.7 mEq/L (ref 3.5–5.1)
Sodium: 137 mEq/L (ref 135–145)

## 2012-09-24 LAB — CBC
MCH: 31.2 pg (ref 26.0–34.0)
Platelets: 117 10*3/uL — ABNORMAL LOW (ref 150–400)
RBC: 4.29 MIL/uL (ref 4.22–5.81)
WBC: 5.3 10*3/uL (ref 4.0–10.5)

## 2012-09-24 LAB — HEPARIN LEVEL (UNFRACTIONATED): Heparin Unfractionated: 0.39 IU/mL (ref 0.30–0.70)

## 2012-09-24 MED ORDER — AMIODARONE HCL 200 MG PO TABS: 400.0000 mg | ORAL_TABLET | Freq: Every day | ORAL | Status: DC

## 2012-09-24 MED ORDER — LORAZEPAM 2 MG/ML IJ SOLN
1.0000 mg | INTRAMUSCULAR | Status: AC | PRN
  Administered 2012-09-24 – 2012-09-26 (×4): 1 mg via INTRAVENOUS
  Filled 2012-09-24 (×4): qty 1

## 2012-09-24 MED ORDER — AMIODARONE HCL 200 MG PO TABS
200.0000 mg | ORAL_TABLET | Freq: Two times a day (BID) | ORAL | Status: DC
  Filled 2012-09-24 (×2): qty 1

## 2012-09-24 MED ORDER — AMIODARONE HCL 200 MG PO TABS
400.0000 mg | ORAL_TABLET | Freq: Two times a day (BID) | ORAL | Status: DC
  Administered 2012-09-24 – 2012-09-27 (×7): 400 mg via ORAL
  Filled 2012-09-24 (×11): qty 2

## 2012-09-24 MED ORDER — WARFARIN SODIUM 5 MG PO TABS
5.0000 mg | ORAL_TABLET | Freq: Once | ORAL | Status: AC
  Administered 2012-09-24: 5 mg via ORAL
  Filled 2012-09-24: qty 1

## 2012-09-24 NOTE — Progress Notes (Signed)
Patient ID: Jorge Klein, male   DOB: 1927/07/21, 79 y.o.   MRN: [id number]    SUBJECTIVE: No problems overnight.  Patient is pleasantly confused.  No dypsnea or chest pain.   [name] amiodarone  200 mg Oral BID  . sodium chloride  3 mL Intravenous Q12H  . Warfarin - Pharmacist Dosing Inpatient   Does not apply q1800  heparin gtt    Filed Vitals:   09/24/12 0300 09/24/12 0600 09/24/12 0700 09/24/12 0738  BP:  120/66 113/71 115/74  Pulse:  70 49 66  Temp: 98.1 F (36.7 C)   97.6 F (36.4 C)  TempSrc: Oral   Oral  Resp:  31 21 14   Height:      Weight:  109.8 kg (242 lb 1 oz)    SpO2:  98% 98% 98%    Intake/Output Summary (Last 24 hours) at 09/24/12 1132 Last data filed at 09/24/12 0700  Gross per 24 hour  Intake  802.2 ml  Output    601 ml  Net  201.2 ml    LABS: Basic Metabolic Panel:  Recent Labs  46/96/29 2240 09/22/12 0100  09/23/12 0630 09/24/12 0518  NA 135  --   < > 137 137  K 4.2  --   < > 3.6 3.7  CL 100  --   < > 101 101  CO2 22  --   < > 20 21  GLUCOSE 129*  --   < > 108* 82  BUN 22  --   < > 24* 20  CREATININE 0.75  --   < > 0.84 0.80  CALCIUM 9.9  --   < > 9.1 8.8  MG 2.1 2.1  --   --   --   < > = values in this interval not displayed. Liver Function Tests:  Recent Labs  09/22/12 0810  AST 26  ALT 15  ALKPHOS 73  BILITOT 1.5*  PROT 6.6  ALBUMIN 3.4*   No results found for this basename: LIPASE, AMYLASE,  in the last 72 hours CBC:  Recent Labs  09/23/12 0630 09/24/12 0518  WBC 7.2 5.3  HGB 14.7 13.4  HCT 43.0 40.2  MCV 93.5 93.7  PLT 113* 117*   Cardiac Enzymes:  Recent Labs  09/22/12 0100 09/22/12 0810 09/22/12 1438  TROPONINI <0.30 <0.30 <0.30   BNP: No components found with this basename: POCBNP,  D-Dimer: No results found for this basename: DDIMER,  in the last 72 hours Hemoglobin A1C: No results found for this basename: HGBA1C,  in the last 72 hours Fasting Lipid Panel: No results found for this basename: CHOL,  HDL, LDLCALC, TRIG, CHOLHDL, LDLDIRECT,  in the last 72 hours Thyroid Function Tests:  Recent Labs  09/22/12 0100  TSH 1.536   Anemia Panel: No results found for this basename: VITAMINB12, FOLATE, FERRITIN, TIBC, IRON, RETICCTPCT,  in the last 72 hours  RADIOLOGY: Dg Chest Port 1 View  09/21/2012   *RADIOLOGY REPORT*  Clinical Data: Shortness of breath  PORTABLE CHEST - 1 VIEW  Comparison: 01/28/2012  Findings: The cardiac shadow is enlarged.  Postsurgical changes are noted.  A defibrillator is noted and stable.  The lungs are clear bilaterally.  IMPRESSION: No acute abnormality noted.   Original Report Authenticated By: [name], [name]    PHYSICAL EXAM General: NAD Neck: JVP 10 cm, no thyromegaly or thyroid nodule.  Lungs: Clear to auscultation bilaterally with normal respiratory effort. CV: Nondisplaced PMI.  Heart regular S1/S2,  no S3/S4, no murmur.  No peripheral edema.   Abdomen: Soft, nontender, no hepatosplenomegaly, no distention.  Neurologic: Alert, oriented to person, confused.  Psych: Normal affect. Extremities: No clubbing or cyanosis.   TELEMETRY: Reviewed telemetry pt in NSR  ASSESSMENT AND PLAN: 79 yo with CAD s/p CABG and ischemic CMP with EF 10%, PAF with h/o CVA, and dementia admitted with VT/ICD discharge.  Family has been having discussions with [name], aiming towards hospice care at home.  ICD turned off.  1. VT: ICD now off.  He is on amiodarone gtt.  I will transition to amiodarone 400 mg po bid today.  2. CAD: Stable no chest pain.  No plan for cardiac cath as we are moving towards hospice/comfort care. 3. Dementia: Stable, pleasantly confused.  4. Ischemic cardiomyopathy: EF 10%.  He has some volume overload on exam but no dyspnea.  Will use Lasix symptomatically.  5. Atrial fibrillation with h/o CVA: Talked to daughter today about warfarin.  She is not sure that she wants to stop it because he had a CVA in 12/13 "a few days after stopping warfarin."    6. Disposition: Family to talk more with hospice/palliative.  I spoke with daughter today.  Likely home with hospice on Monday .  [name] 09/24/2012 11:36 AM

## 2012-09-24 NOTE — Progress Notes (Signed)
Pt experienced coughing spell while eating lunch.  Noted to have minimal oozing from bilateral nasal passages.  Bleeding did not require pressure to be held to nasal bridge and resolved within 5 minutes.  Avonne Berkery, [facility]

## 2012-09-24 NOTE — Progress Notes (Signed)
ANTICOAGULATION CONSULT NOTE - Follow Up Consult  Pharmacy Consult for Heparin/Coumadin Indication: Afib  No Known Allergies  Patient Measurements: Height: 6\' 3"  (190.5 cm) Weight: 242 lb 1 oz (109.8 kg) IBW/kg (Calculated) : 84.5 Heparin Dosing Weight: 106 8kg   Vital Signs: Temp: 97.2 F (36.2 C) (08/30 1134) Temp src: Oral (08/30 1134) BP: 104/59 mmHg (08/30 1134) Pulse Rate: 75 (08/30 1134)  Labs:  Recent Labs  09/22/12 0100 09/22/12 0810  09/22/12 0920 09/22/12 1438 09/22/12 2000 09/23/12 0630 09/23/12 1300 09/24/12 0518  HGB  --   --   --  14.8  --   --  14.7  --  13.4  HCT  --   --   --  43.1  --   --  43.0  --  40.2  PLT  --   --   --  158  --   --  113*  --  117*  APTT 39*  --   --   --   --   --   --   --   --   LABPROT  --   --   --  22.7*  --   --  20.0*  --  16.8*  INR  --   --   --  2.08*  --   --  1.76*  --  1.40  HEPARINUNFRC  --   --   < > 0.27*  --  0.80* 0.41 0.61  --   CREATININE  --  0.81  --   --   --   --  0.84  --  0.80  TROPONINI <0.30 <0.30  --   --  <0.30  --   --   --   --   < > = values in this interval not displayed.  Estimated Creatinine Clearance: 93.6 ml/min (by C-G formula based on Cr of 0.8).  Assessment: AICD firing  Anticoagulation: Warfarin PTA (3mg  dialy) for hx Afib (Vit K 2mg  SQ 8/28) resumed. Plts 117. Oozing from nose noted during coughing spell 8/30. Home dose 3mg /day (last dose 8/27). Heparin level 0.39. INR down to 1.4. Dose charted 8/29.  Cardiovascular: Hx CHF/Afib/ICM/ICD(placed '11) and admitted with AICD firing x 2. Interrogation revealed known AT/AF >6 hrs/day for 292 days, shocked d/t HR of 240. MD notes ICD has been turned off. Meds: PO Amio and heparin drip only.  Endocrinology: Hx DM/gout. TSH wnl. CBGs at goal on BMET. On no SSI  Gastrointestinal / Nutrition: Heart healthy diet   Neurology: Hx CVA. No sedation. Pleasantly confused/dementia.  Nephrology: SCr 0.81, CrCl~93 ml/min (normalized).    Pulmonary: 99%  Hematology / Oncology: CBC wnl  Best Practices: Hep IV  Goal of Therapy:  Heparin level 0.3-0.7 units/ml INR 2-3 Monitor platelets by anticoagulation protocol: Yes   Plan:  IV heparin 1150 units/hr to continue Coumadin 5mg  po x 1 again tonight.   Lowen Mansouri S. Merilynn Finland, PharmD, BCPS Clinical Staff Pharmacist Pager [phone]  Misty Stanley Stillinger 09/24/2012,1:28 PM

## 2012-09-25 LAB — CBC
MCH: 31.9 pg (ref 26.0–34.0)
MCHC: 34.1 g/dL (ref 30.0–36.0)
Platelets: 124 10*3/uL — ABNORMAL LOW (ref 150–400)
RBC: 4.45 MIL/uL (ref 4.22–5.81)

## 2012-09-25 LAB — PROTIME-INR: Prothrombin Time: 16.2 seconds — ABNORMAL HIGH (ref 11.6–15.2)

## 2012-09-25 LAB — BASIC METABOLIC PANEL
Chloride: 103 mEq/L (ref 96–112)
Creatinine, Ser: 0.88 mg/dL (ref 0.50–1.35)
GFR calc Af Amer: 90 mL/min — ABNORMAL LOW (ref >90)
Sodium: 138 mEq/L (ref 135–145)

## 2012-09-25 LAB — HEPARIN LEVEL (UNFRACTIONATED): Heparin Unfractionated: 0.37 IU/mL (ref 0.30–0.70)

## 2012-09-25 MED ORDER — FUROSEMIDE 40 MG PO TABS
40.0000 mg | ORAL_TABLET | Freq: Every day | ORAL | Status: DC
  Administered 2012-09-25 – 2012-09-28 (×4): 40 mg via ORAL
  Filled 2012-09-25 (×5): qty 1

## 2012-09-25 MED ORDER — WARFARIN SODIUM 7.5 MG PO TABS
7.5000 mg | ORAL_TABLET | Freq: Once | ORAL | Status: AC
  Administered 2012-09-25: 7.5 mg via ORAL
  Filled 2012-09-25: qty 1

## 2012-09-25 MED ORDER — CARVEDILOL 3.125 MG PO TABS
3.1250 mg | ORAL_TABLET | Freq: Two times a day (BID) | ORAL | Status: DC
  Administered 2012-09-25 – 2012-09-27 (×5): 3.125 mg via ORAL
  Filled 2012-09-25 (×8): qty 1

## 2012-09-25 NOTE — Progress Notes (Signed)
ANTICOAGULATION CONSULT NOTE - Follow Up Consult  Pharmacy Consult for Heparin/Coumadin Indication: Afib  No Known Allergies  Patient Measurements: Height: 6' (182.9 cm) Weight: 238 lb 3.2 oz (108.047 kg) IBW/kg (Calculated) : 77.6 Heparin Dosing Weight: 106 8kg   Vital Signs: Temp: 97.7 F (36.5 C) (08/31 0501) Temp src: Oral (08/31 0501) BP: 112/61 mmHg (08/31 0501) Pulse Rate: 56 (08/31 0501)  Labs:  Recent Labs  09/22/12 0810  09/22/12 1438  09/23/12 0630 09/23/12 1300 09/24/12 0518 09/24/12 1419 09/25/12 0450  HGB  --   < >  --   --  14.7  --  13.4  --  14.2  HCT  --   < >  --   --  43.0  --  40.2  --  41.6  PLT  --   < >  --   --  113*  --  117*  --  124*  LABPROT  --   < >  --   --  20.0*  --  16.8*  --  16.2*  INR  --   < >  --   --  1.76*  --  1.40  --  1.33  HEPARINUNFRC  --   < >  --   < > 0.41 0.61  --  0.39 0.37  CREATININE 0.81  --   --   --  0.84  --  0.80  --  0.88  TROPONINI <0.30  --  <0.30  --   --   --   --   --   --   < > = values in this interval not displayed.  Estimated Creatinine Clearance: 80.8 ml/min (by C-G formula based on Cr of 0.88).  Assessment:  79 yo with hx of PAF with h/o CVA, CAD s/p CABG and ischemic CMP, and dementia admitted with VT/ICD discharge/firing (ICD is off). Family aiming towards hospice care at home. Pt was restarted on Warfarin on 8/29 per pharmacy with heparin bridge.  Pt was noted to have slight blood oozing from nose during coughing spell on 8/30 which resolved.  Home dose warfarin is 3mg /day (last dose 8/27).   Today, heparin level is 0.37-therapeutic, and INR is down to 1.33- (subtherapeutic) despite warfarin dose last night.  Pt is also on Amiodarone which can potentially elevate INR.  Pt's h/h wnl, plts are stable, no more bleeding noted since yesterday's episode.  Goal of Therapy:  Heparin level 0.3-0.7 units/ml INR 2-3 Monitor platelets by anticoagulation protocol: Yes   Plan:  - Continue IV heparin  1150 units/hr  - Increase Coumadin to 7.5mg  po x 1 tonight -Monitor daily PT/INR  Anabel Bene, PharmD Clinical Pharmacist Resident Pager: [phone]  Anabel Bene 09/25/2012,7:58 AM

## 2012-09-25 NOTE — Progress Notes (Signed)
Patient Name: Jorge Klein      SUBJECTIVE: Admitted with ICD shocks. Interrogation demonstrates inappropriate shocks from 1 atrial flutter; interrogation also demonstrates no activity essentially since December.   Echocardiogram demonstrates 10-15% with severe biatrial enlargement mild-moderate AV valvular regurgitation. catheterization by Dr. Mayford Knife 3/11 demonstrated diffuse three-vessel disease   History of coronary disease with prior bypass. Prior stroke December 2013 with progressive weakness and dementia.  Past Medical History  Diagnosis Date  . CHF (congestive heart failure)   . Stroke   . Gout   . Arthritis   . Ischemic cardiomyopathy   . Myocardial infarction   . Dementia     has not been diagnosed per family    Scheduled Meds:  Scheduled Meds: . amiodarone  400 mg Oral BID  . sodium chloride  3 mL Intravenous Q12H  . warfarin  7.5 mg Oral ONCE-1800  . Warfarin - Pharmacist Dosing Inpatient   Does not apply q1800   Continuous Infusions: . sodium chloride 10 mL/hr at 09/23/12 0926  . heparin 1,150 Units/hr (09/24/12 2305)    PHYSICAL EXAM Filed Vitals:   09/24/12 1134 09/24/12 1458 09/24/12 2000 09/25/12 0501  BP: 104/59 133/87 117/72 112/61  Pulse: 75 66 64 56  Temp: 97.2 F (36.2 C) 97.6 F (36.4 C) 97.8 F (36.6 C) 97.7 F (36.5 C)  TempSrc: Oral Oral  Oral  Resp: 13 16 17 17   Height:      Weight:    238 lb 3.2 oz (108.047 kg)  SpO2: 99% 98% 96% 95%   Arouses to voice and is engages in conversation but not oriented to place or time Crackles bilaterally laterally Irregula rate 2/6 systolic murmur at the apex  protuberant abdomen was soft Warm and dry 2+ peripheral edema     TELEMETRY: Reviewed telemetry pt in  He is on no meds no margin and smooth him in a couple of hours he was also) his home meds has not of:    Intake/Output Summary (Last 24 hours) at 09/25/12 0923 Last data filed at 09/25/12 1610  Gross per 24 hour  Intake       0 ml  Output    600 ml  Net   -600 ml    LABS: Basic Metabolic Panel:  Recent Labs Lab 09/21/12 2240 09/22/12 0100 09/22/12 0810 09/23/12 0630 09/24/12 0518 09/25/12 0450  NA 135  --  138 137 137 138  K 4.2  --  3.9 3.6 3.7 3.4*  CL 100  --  101 101 101 103  CO2 22  --  25 20 21 26   GLUCOSE 129*  --  131* 108* 82 102*  BUN 22  --  22 24* 20 15  CREATININE 0.75  --  0.81 0.84 0.80 0.88  CALCIUM 9.9  --  9.3 9.1 8.8 9.1  MG 2.1 2.1  --   --   --   --    Cardiac Enzymes:  Recent Labs  09/22/12 1438  TROPONINI <0.30   CBC:  Recent Labs Lab 09/21/12 2240 09/22/12 0920 09/23/12 0630 09/24/12 0518 09/25/12 0450  WBC 10.4 8.3 7.2 5.3 5.1  HGB 16.0 14.8 14.7 13.4 14.2  HCT 46.2 43.1 43.0 40.2 41.6  MCV 93.9 94.3 93.5 93.7 93.5  PLT 148* 158 113* 117* 124*   PROTIME:  Recent Labs  09/23/12 0630 09/24/12 0518 09/25/12 0450  LABPROT 20.0* 16.8* 16.2*  INR 1.76* 1.40 1.33   Liver Function Tests: No results found  for this basename: AST, ALT, ALKPHOS, BILITOT, PROT, ALBUMIN,  in the last 72 hours No results found for this basename: LIPASE, AMYLASE,  in the last 72 hours BNP: BNP (last 3 results)  Recent Labs  09/21/12 2240 09/22/12 0100  PROBNP 3636.0* 3708.0*   D-Dimer: No results found for this basename: DDIMER,  in the last 72 hours Hemoglobin A1C: No results found for this basename: HGBA1C,  in the last 72 hours Fasting Lipid Panel: No results found for this basename: CHOL, HDL, LDLCALC, TRIG, CHOLHDL, LDLDIRECT,  in the last 72 hours Thyroid Function Tests: No results found for this basename: TSH, T4TOTAL, FREET3, T3FREE, THYROIDAB,  in the last 72 hours Anemia Panel: No results found for this basename: VITAMINB12, FOLATE, FERRITIN, TIBC, IRON, RETICCTPCT,  in the last 72 hours   Device Interrogation: atrial flutter with 1:1 conduction resulted in inappropriate shocks   ASSESSMENT AND PLAN:  Active Problems:   HTN (hypertension)    History of CVA (cerebrovascular accident)   A-fib   Diabetes   Ischemic cardiomyopathy   Dyspnea   Weakness generalized   Palliative care encounter  The patient presented with an appropriate shock from atrial flutter with 11 conduction. Amiodarone has been initiated which is a reasonable undertaking.  He has severe ischemic cardiomyopathy and congestive heart failure. Diuresis and afterload reduction seems appropriate. We'll begin gently today.   Prognosis is not great. Defibrillator therapy has been inactivated i  Signed, Sherryl Manges MD  09/25/2012

## 2012-09-26 LAB — CBC
MCH: 32 pg (ref 26.0–34.0)
MCV: 94.8 fL (ref 78.0–100.0)
Platelets: 142 10*3/uL — ABNORMAL LOW (ref 150–400)
RDW: 14.8 % (ref 11.5–15.5)
WBC: 6.1 10*3/uL (ref 4.0–10.5)

## 2012-09-26 MED ORDER — WARFARIN SODIUM 5 MG PO TABS
5.0000 mg | ORAL_TABLET | Freq: Once | ORAL | Status: AC
  Administered 2012-09-26: 5 mg via ORAL
  Filled 2012-09-26: qty 1

## 2012-09-26 NOTE — Consult Note (Signed)
I have reviewed and discussed the care of this patient in detail with the nurse practitioner including pertinent patient records, physical exam findings and data. I agree with details of this encounter.  

## 2012-09-26 NOTE — Progress Notes (Signed)
ANTICOAGULATION CONSULT NOTE - Follow Up Consult  Pharmacy Consult for Heparin and Warfarin Indication: atrial fibrillation  No Known Allergies  Patient Measurements: Height: 6' (182.9 cm) Weight: 232 lb 4.8 oz (105.371 kg) IBW/kg (Calculated) : 77.6 Heparin Dosing Weight: 99.52 kg  Vital Signs: Temp: 97.9 F (36.6 C) (09/01 1501) Temp src: Oral (09/01 1501) BP: 105/74 mmHg (09/01 1501) Pulse Rate: 50 (09/01 1501)  Labs:  Recent Labs  09/24/12 0518 09/24/12 1419 09/25/12 0450 09/26/12 0500  HGB 13.4  --  14.2 14.1  HCT 40.2  --  41.6 41.8  PLT 117*  --  124* 142*  LABPROT 16.8*  --  16.2* 17.0*  INR 1.40  --  1.33 1.42  HEPARINUNFRC  --  0.39 0.37 0.38  CREATININE 0.80  --  0.88  --     Estimated Creatinine Clearance: 79.8 ml/min (by C-G formula based on Cr of 0.88).   Medications:  Scheduled:  . amiodarone  400 mg Oral BID  . carvedilol  3.125 mg Oral BID WC  . furosemide  40 mg Oral Daily  . sodium chloride  3 mL Intravenous Q12H  . Warfarin - Pharmacist Dosing Inpatient   Does not apply q1800   Infusions:  . sodium chloride 10 mL/hr at 09/25/12 2326  . heparin 1,150 Units/hr (09/25/12 2326)    Assessment: 79 yo M admitted with ICD firing, interrogation revealing VT/VF. Pharmacy consulted for restart of warfarin after reversal with Vit K 2mg  SQ and hold for expected cardiac cath.  However cath was cancelled.  Patient has also been started on amiodarone.   He is currently on Day#4 of heparin and coumadin overlap. Heparin is therapeutic today at 0.38.  His INR is still subtherapeutic and rising to 1.42.  Suspect we are still overcoming some Vit K administration and are now beginning the uprise.  H/H is stable and platelets are low but trending up.  Spoke with nurse and no reports of bleeding issues since episode of nose bleed on 8/30.  Coming from a home dose of just 3mg  daily, known interactions/concerns with amiodarone, and the waning affect of Vit K; will  be more conservative with dosing today. Will reduce dose, and suspect that tomorrow will be more telling of response.  Goal of Therapy:  INR 2-3 Heparin level 0.3-0.7 units/ml Monitor platelets by anticoagulation protocol: Yes   Plan:  - Coumadin PO 5mg  x1 dose tonight - continue IV heparin 1150 units/hr  - hospice care consult, options, affirm goals; will differ coumadin education for now - daily INR, hep lvl, and CBC   Rheanne Cortopassi E. Achilles Dunk, PharmD Clinical Pharmacist - Resident Pager: [phone] Pharmacy: [phone] 09/26/2012 3:38 PM

## 2012-09-26 NOTE — Progress Notes (Addendum)
No updated information from hospice and palliative care. AICD has been the deactivated.  No family currently available.  The patient has no complaints and is asleep. A sitter is present in the room.  Vital signs are stable. Chest is clear anteriorly. Rhythm is regular.  Plan is supportive with hospice and palliative care. Discharge to facility later this week upon instruction from family/hospice/care management.

## 2012-09-26 NOTE — Progress Notes (Signed)
Progress Note from the Palliative Medicine Team at [facility]  Subjective:  Patient is oriented to person and place, being feed breakfast by CNA  -call placed to son Catherine Oak, plan is still to go home with hospice services when discharged (will write for hospice choice)     Objective: No Known Allergies Scheduled Meds: . amiodarone  400 mg Oral BID  . carvedilol  3.125 mg Oral BID WC  . furosemide  40 mg Oral Daily  . sodium chloride  3 mL Intravenous Q12H  . Warfarin - Pharmacist Dosing Inpatient   Does not apply q1800   Continuous Infusions: . sodium chloride 10 mL/hr at 09/25/12 2326  . heparin 1,150 Units/hr (09/25/12 2326)   PRN Meds:.sodium chloride, acetaminophen, LORazepam, morphine injection, ondansetron (ZOFRAN) IV, sodium chloride  BP 132/68  Pulse 62  Temp(Src) 97.8 F (36.6 C) (Oral)  Resp 20  Ht 6' (1.829 m)  Wt 105.371 kg (232 lb 4.8 oz)  BMI 31.5 kg/m2  SpO2 97%   PPS:30 %  Pain Score:denies   Intake/Output Summary (Last 24 hours) at 09/26/12 0930 Last data filed at 09/26/12 0520  Gross per 24 hour  Intake    120 ml  Output   2475 ml  Net  -2355 ml       Physical Exam:  General: chronically ill appearing, NAD HEENT:  Dry buccal membranes Chest:   Diminished, scattered  CVS: irregular, noted murmur Abdomen: soft NT +BS Ext:  +1 BLE edema Neuro: lethargic, oriented to person and place   Labs: CBC    Component Value Date/Time   WBC 6.1 09/26/2012 0500   RBC 4.41 09/26/2012 0500   HGB 14.1 09/26/2012 0500   HCT 41.8 09/26/2012 0500   PLT 142* 09/26/2012 0500   MCV 94.8 09/26/2012 0500   MCH 32.0 09/26/2012 0500   MCHC 33.7 09/26/2012 0500   RDW 14.8 09/26/2012 0500   LYMPHSABS 1.0 01/28/2012 1636   MONOABS 0.7 01/28/2012 1636   EOSABS 0.2 01/28/2012 1636   BASOSABS 0.0 01/28/2012 1636    BMET    Component Value Date/Time   NA 138 09/25/2012 0450   K 3.4* 09/25/2012 0450   CL 103 09/25/2012 0450   CO2 26 09/25/2012 0450   GLUCOSE 102* 09/25/2012 0450    BUN 15 09/25/2012 0450   CREATININE 0.88 09/25/2012 0450   CALCIUM 9.1 09/25/2012 0450   GFRNONAA 77* 09/25/2012 0450   GFRAA 90* 09/25/2012 0450    CMP     Component Value Date/Time   NA 138 09/25/2012 0450   K 3.4* 09/25/2012 0450   CL 103 09/25/2012 0450   CO2 26 09/25/2012 0450   GLUCOSE 102* 09/25/2012 0450   BUN 15 09/25/2012 0450   CREATININE 0.88 09/25/2012 0450   CALCIUM 9.1 09/25/2012 0450   PROT 6.6 09/22/2012 0810   ALBUMIN 3.4* 09/22/2012 0810   AST 26 09/22/2012 0810   ALT 15 09/22/2012 0810   ALKPHOS 73 09/22/2012 0810   BILITOT 1.5* 09/22/2012 0810   GFRNONAA 77* 09/25/2012 0450   GFRAA 90* 09/25/2012 0450      Assessment and Plan: 1. Code Status: Partial 2. Symptom Control:Pain/Dyspnea: Morphine 2 mg IV every 3 hrs prn for anticipatory needs 2. Psycho/Social: Emotional support to son via telephone.  Questions and concern addressed 3. Disposition:  Family is hopeful for  home with hospice on discharge, will write for choice.  Daughter Calyn Rubi is main caregiver in the home  # [phone] is  source to communicate care).       Lorinda Creed NP  Palliative Medicine Team Team Phone # [phone] Pager [phone]   1

## 2012-09-27 LAB — URINALYSIS, ROUTINE W REFLEX MICROSCOPIC
Nitrite: POSITIVE — AB
Specific Gravity, Urine: 1.022 (ref 1.005–1.030)
Urobilinogen, UA: 4 mg/dL — ABNORMAL HIGH (ref 0.0–1.0)

## 2012-09-27 LAB — BASIC METABOLIC PANEL
GFR calc Af Amer: 86 mL/min — ABNORMAL LOW (ref >90)
GFR calc non Af Amer: 74 mL/min — ABNORMAL LOW (ref >90)
Potassium: 3.1 mEq/L — ABNORMAL LOW (ref 3.5–5.1)
Sodium: 142 mEq/L (ref 135–145)

## 2012-09-27 LAB — URINE MICROSCOPIC-ADD ON

## 2012-09-27 LAB — CBC
MCV: 92.9 fL (ref 78.0–100.0)
Platelets: 138 10*3/uL — ABNORMAL LOW (ref 150–400)
RBC: 4.53 MIL/uL (ref 4.22–5.81)
WBC: 6.4 10*3/uL (ref 4.0–10.5)

## 2012-09-27 LAB — PROTIME-INR: Prothrombin Time: 22 seconds — ABNORMAL HIGH (ref 11.6–15.2)

## 2012-09-27 LAB — HEPARIN LEVEL (UNFRACTIONATED): Heparin Unfractionated: 0.39 IU/mL (ref 0.30–0.70)

## 2012-09-27 MED ORDER — CARVEDILOL 3.125 MG PO TABS: 3.1250 mg | ORAL_TABLET | Freq: Two times a day (BID) | ORAL | Status: DC

## 2012-09-27 MED ORDER — POTASSIUM CHLORIDE CRYS ER 20 MEQ PO TBCR
40.0000 meq | EXTENDED_RELEASE_TABLET | Freq: Every day | ORAL | Status: DC
  Administered 2012-09-28: 40 meq via ORAL
  Filled 2012-09-27: qty 2

## 2012-09-27 MED ORDER — MORPHINE SULFATE (CONCENTRATE) 10 MG /0.5 ML PO SOLN: 5.0000 mg | ORAL | Status: DC | PRN

## 2012-09-27 MED ORDER — AMIODARONE HCL 400 MG PO TABS: ORAL_TABLET | ORAL | Status: DC

## 2012-09-27 MED ORDER — POTASSIUM CHLORIDE CRYS ER 20 MEQ PO TBCR: 40.0000 meq | EXTENDED_RELEASE_TABLET | Freq: Every day | ORAL | Status: AC

## 2012-09-27 MED ORDER — FUROSEMIDE 40 MG PO TABS: 40.0000 mg | ORAL_TABLET | Freq: Every day | ORAL | Status: AC

## 2012-09-27 MED ORDER — POTASSIUM CHLORIDE CRYS ER 20 MEQ PO TBCR
40.0000 meq | EXTENDED_RELEASE_TABLET | ORAL | Status: AC
  Administered 2012-09-27 – 2012-09-28 (×2): 40 meq via ORAL
  Filled 2012-09-27 (×2): qty 2

## 2012-09-27 MED ORDER — LORAZEPAM 1 MG PO TABS: 1.0000 mg | ORAL_TABLET | Freq: Four times a day (QID) | ORAL | Status: AC | PRN

## 2012-09-27 MED ORDER — LORAZEPAM 1 MG PO TABS: 1.0000 mg | ORAL_TABLET | Freq: Four times a day (QID) | ORAL | Status: DC | PRN

## 2012-09-27 MED ORDER — MORPHINE SULFATE (CONCENTRATE) 10 MG /0.5 ML PO SOLN: 5.0000 mg | ORAL | Status: AC | PRN

## 2012-09-27 MED ORDER — WARFARIN SODIUM 3 MG PO TABS
3.0000 mg | ORAL_TABLET | Freq: Every day | ORAL | Status: DC
  Administered 2012-09-27: 3 mg via ORAL
  Filled 2012-09-27 (×2): qty 1

## 2012-09-27 NOTE — Progress Notes (Signed)
ANTICOAGULATION CONSULT NOTE - Follow Up Consult  Pharmacy Consult for Heparin and Warfarin Indication: atrial fibrillation  No Known Allergies  Labs:  Recent Labs  09/25/12 0450 09/26/12 0500 09/27/12 0410  HGB 14.2 14.1 14.4  HCT 41.6 41.8 42.1  PLT 124* 142* 138*  LABPROT 16.2* 17.0* 22.0*  INR 1.33 1.42 1.99*  HEPARINUNFRC 0.37 0.38 0.39  CREATININE 0.88  --   --     Estimated Creatinine Clearance: 79.9 ml/min (by C-G formula based on Cr of 0.88).   Assessment: 79 yo M admitted with ICD firing, interrogation revealing VT/VF. Pharmacy consulted for restart of warfarin after reversal with Vit K 2mg  SQ and hold for expected cardiac cath.  However cath was cancelled.  Patient has also been started on amiodarone.   He is currently on Day#5of heparin and coumadin overlap. Heparin is therapeutic today at 0.39.  INR = 1.99  Goal of Therapy:  INR 2-3 Heparin level 0.3-0.7 units/ml Monitor platelets by anticoagulation protocol: Yes   Plan:  - Decrease Coumadin to 3mg  po daily at 1800 pm - continue IV heparin 1150 units/hr  - daily INR, hep lvl, and CBC  Thank you. Okey Regal, PharmD [phone]  09/27/2012 8:35 AM

## 2012-09-27 NOTE — Progress Notes (Signed)
Notified by Jorge Klein, patient and family request services of [facility]) after discharge.  Patient seen at bedside alert, pleasant but confused, sitter and wife with patient while Clinical research associate spoke with patient's daughter Jorge Klein and son Jorge Klein away from bedside to initiate education related to hospice services, philosophy and team approach to care- daughter/ son voiced good understanding of information provided.  Patient currently using condom catheter and family would like this to remain at discharge for comfort  * Son Jorge Klein informed Clinical research associate that he had not spoken directly with Dr Mayford Knife since 'all this has been decided' and wanted to be sure she was in agreement - Message left for Dr Mayford Knife with request to call patient's son Jorge Klein [phone] - he stated he did not want to stop the process, did still want to engage hospice services but did want to be reassured by Dr Mayford Knife that she was in agreement with hospice and a more palliative direction.      Per discussion plan is to discharge by non-emergent transport tomorrow, Wednesday 09/28/12   DME needs were discussed- family currently has a hospital bed, BSC and w/c in the home DME from [place] and daughter plans to discuss need for switching out this equipment once patient returns home; daughter requesting overbed table and  O2 package B - pt was on O2 @ 2LNC now on RA with O2 as needed- patient has had episodes of SOB and de-sating and family is requesting O2 prn to be available  Initial paperwork faxed to [facility]  Completed d/c summary will need to be faxed to [facility] @ [phone] when final Please notify [facility] when patient is ready to leave unit at d/c call [phone] (or [phone] if after 5 pm);  [facility] information and contact numbers also given to daughter Jorge Klein  during visit.   Above information shared with Steward Drone [facility] Please call with any questions or concerns   Valente David, RN 09/27/2012, 2:55  PM [facility] Palliative Medicine Team RN Liaison [phone]

## 2012-09-27 NOTE — Progress Notes (Signed)
UR Completed Marquarius Lofton Graves-Bigelow, RN,BSN [phone]  

## 2012-09-27 NOTE — Discharge Summary (Addendum)
Patient ID: Jorge Klein MRN: [id number] DOB/AGE: [id number] 77 y.o.  Admit date: 09/21/2012 Discharge date: 09/28/2012  Primary Discharge Diagnosis Ventricular tachycardia with AICD firing Secondary Discharge Diagnosis  Acute on chronic systolic CHF  CAD s/p remote CABG  Type 2 DM  CVA  Gout  Arthritis  Ischemic dilated CM EF 10%-20%  Dementia  HTN  Dyslipidemia  Atrial fibrillation on systemic anticoagulation   Significant Diagnostic Studies: cardiac graphics: Echocardiogram:  Study Conclusions  - Left ventricle: The cavity size was severely dilated. Systolic function was severely reduced. The estimated ejection fraction was in the range of 10% to 20%. Diffuse hypokinesis. The study is not technically sufficient to allow evaluation of LV diastolic function. - Aortic valve: Mild regurgitation. - Mitral valve: Mild to moderate regurgitation. - Left atrium: The atrium was severely dilated. - Right ventricle: The cavity size was mildly dilated. - Tricuspid valve: Moderate regurgitation. - Pulmonary arteries: Systolic pressure was moderately to severely increased. PA peak pressure: 51mm Hg (S).     Consults: cardiology - EP  Hospital Course: 79 yo man with PMH of T2DM, CAD s/p prior CABG, CVA in December 2013/January 2014, dementia here with ICD firing with interrogation revealing VT/VF. At home he has progressed to requiring transfer by his family 2/2 leg weakness and LUE weakness. He has chronically swollen ankles, takes lasix every other day only because he doesn't like to get up to pee frequently and last saw me over a year ago. He's not been ill recently, no change in medications (takes lasix every other day and warfarin 3 mg qHS). On further questioning, his daughter stated that he had been  more SOB than usually. He was also accompanied by his grandson and grandson's girlfriend. His family said that he has worsened since his stroke in December 2013/January 2014  hospitalization and he would benefit from a nursing home/facility but this was financially not feasible for the family. He was noted to be shocked twice transferring from the toilet (after sitting for a long time) by his daughter, a small shock and then a big shock. It was hard to determine if he was light-headed beforehand or had other symptoms. No known ongoing chest pain at home. No recent syncope. No previous shocks. ICD placed in 2011 shortly after CABG according to daughter. Pt received 2 shocks with one failed therapy along with known AT/AF > 6 hrs daily for 292 days. 32 seconds of HR 240 requiring 30J followed by 35J shock.   He was admitted to the ICU and started on IV Amiodarone with no further VT.  2D echo was obtained which showed severe LV dysfunction with EF 10-20%.  He was found to be in acute on chronic systolic CHF and was started on IV Lasix and diuresed well.  Given his co morbidities, advanced age, dementia and declining state over the past few months it was decided not to pursue any further invasive workup and a Hospice consult was obtained.  His ICD was turned off per request of the family.  He was continued on IV Heparin gtt until INR was therapeutic on coumadin.  His daughter was adamant that she wanted him to stay on coumadin given recent CVA earlier this year.  He was switched to PO Amio which he tolerated well.  On day of discharge he was in stable condition and was transferred to home with Home [facility].  His coreg was stopped due to bradycardia and borderline low BP.      Discharge  Exam: Blood pressure 96/64, pulse 50, temperature 97.8 F (36.6 C), temperature source Oral, resp. rate 27, height 6' (1.829 m), weight 105.597 kg (232 lb 12.8 oz), SpO2 95.00%.   General appearance: alert Resp: clear to auscultation bilaterally Cardio: regular rate and rhythm, S1, S2 normal, no murmur, click, rub or gallop EXT:  No Cyanosis/erythema or edema   Lab Results  Component Value Date    WBC 6.4 09/27/2012   HGB 14.4 09/27/2012   HCT 42.1 09/27/2012   MCV 92.9 09/27/2012   PLT 138* 09/27/2012    Recent Labs Lab 09/22/12 0810  09/27/12 1044  NA 138  < > 142  K 3.9  < > 3.1*  CL 101  < > 104  CO2 25  < > 28  BUN 22  < > 17  CREATININE 0.81  < > 0.98  CALCIUM 9.3  < > 9.4  PROT 6.6  --   --   BILITOT 1.5*  --   --   ALKPHOS 73  --   --   ALT 15  --   --   AST 26  --   --   GLUCOSE 131*  < > 101*  < > = values in this interval not displayed. Lab Results  Component Value Date   CKTOTAL 79 01/26/2012   CKMB 3.9 01/26/2012   TROPONINI <0.30 09/22/2012    Lab Results  Component Value Date   CHOL 151 01/30/2012   Lab Results  Component Value Date   HDL 36* 01/30/2012   Lab Results  Component Value Date   LDLCALC 98 01/30/2012   Lab Results  Component Value Date   TRIG 84 01/30/2012   Lab Results  Component Value Date   CHOLHDL 4.2 01/30/2012   No results found for this basename: LDLDIRECT      Radiology:  *RADIOLOGY REPORT*  Clinical Data: Shortness of breath  PORTABLE CHEST - 1 VIEW  Comparison: 01/28/2012  Findings: The cardiac shadow is enlarged. Postsurgical changes are  noted. A defibrillator is noted and stable. The lungs are clear  bilaterally.  IMPRESSION:  No acute abnormality noted.  Original Report Authenticated By: [name], [name]  EKG:NSR with PVC's, nonspecific IVCD  FOLLOW UP PLANS AND APPOINTMENTS    Medication List         amiodarone 400 MG tablet  Commonly known as:  PACERONE  Take 1 tablet  daily     furosemide 40 MG tablet  Commonly known as:  LASIX  Take 1 tablet (40 mg total) by mouth daily.     LORazepam 1 MG tablet  Commonly known as:  ATIVAN  Take 1 tablet (1 mg total) by mouth every 6 (six) hours as needed for anxiety.     morphine CONCENTRATE 10 mg / 0.5 ml concentrated solution  Take 0.25 mLs (5 mg total) by mouth every 3 (three) hours as needed.     potassium chloride SA 20 MEQ tablet  Commonly known as:   K-DUR,KLOR-CON  Take 2 tablets (40 mEq total) by mouth daily.  Start taking on:  09/28/2012     warfarin 3 MG tablet  Commonly known as:  COUMADIN  Take 3 mg by mouth daily.         BRING ALL MEDICATIONS WITH YOU TO FOLLOW UP APPOINTMENTS  Time spent with patient to include physician time:45 minutes Signed: Quintella Reichert 09/27/2012, 7:44 PM

## 2012-09-27 NOTE — Progress Notes (Addendum)
SUBJECTIVE:  No complaints this am  OBJECTIVE:   Vitals:   Filed Vitals:   09/26/12 0900 09/26/12 1501 09/26/12 2130 09/27/12 0505  BP: 132/68 105/74 106/57 125/80  Pulse: 62 50 57 53  Temp: 97.8 F (36.6 C) 97.9 F (36.6 C) 97.4 F (36.3 C) 97.9 F (36.6 C)  TempSrc: Oral Oral Oral Oral  Resp:  20 20 16   Height:      Weight:    105.597 kg (232 lb 12.8 oz)  SpO2:  96%  96%   I&O's:   Intake/Output Summary (Last 24 hours) at 09/27/12 0835 Last data filed at 09/26/12 1836  Gross per 24 hour  Intake    660 ml  Output   1350 ml  Net   -690 ml   TELEMETRY: Reviewed telemetry pt in NSR:     PHYSICAL EXAM General: Well developed, well nourished, in no acute distress Head: Eyes PERRLA, No xanthomas.   Normal cephalic and atramatic, tongue with white plaque  Lungs:   Clear bilaterally to auscultation and percussion. Heart:   HRRR S1 S2 Pulses are 2+ & equal. Abdomen: Bowel sounds are positive, abdomen soft and non-tender without masses Extremities:   No clubbing, cyanosis or edema.  DP +1 Neuro: Alert and oriented X 3. Psych:  Good affect, responds appropriately   LABS: Basic Metabolic Panel:  Recent Labs  16/10/96 0450  NA 138  K 3.4*  CL 103  CO2 26  GLUCOSE 102*  BUN 15  CREATININE 0.88  CALCIUM 9.1   Liver Function Tests: No results found for this basename: AST, ALT, ALKPHOS, BILITOT, PROT, ALBUMIN,  in the last 72 hours No results found for this basename: LIPASE, AMYLASE,  in the last 72 hours CBC:  Recent Labs  09/26/12 0500 09/27/12 0410  WBC 6.1 6.4  HGB 14.1 14.4  HCT 41.8 42.1  MCV 94.8 92.9  PLT 142* 138*   Coag Panel:   Lab Results  Component Value Date   INR 1.99* 09/27/2012   INR 1.42 09/26/2012   INR 1.33 09/25/2012    RADIOLOGY: Dg Chest Port 1 View  09/21/2012   *RADIOLOGY REPORT*  Clinical Data: Shortness of breath  PORTABLE CHEST - 1 VIEW  Comparison: 01/28/2012  Findings: The cardiac shadow is enlarged.  Postsurgical changes are  noted.  A defibrillator is noted and stable.  The lungs are clear bilaterally.  IMPRESSION: No acute abnormality noted.   Original Report Authenticated By: [name], [name]     ASSESSMENT AND PLAN:  Active Problems:  HTN (hypertension)  History of CVA (cerebrovascular accident)  A-fib  Diabetes  Ischemic cardiomyopathy  Dyspnea  Weakness generalized  Palliative care encounter   Continue Amiodarone  He has severe ischemic cardiomyopathy and congestive heart failure. Prognosis is poor. Defibrillator therapy has been inactivated per family request Await further recs from Palliative Care Check UA since urine appears slightly blood tinged and he is on anticoagulation Continue low dose beta blocker and diuretic.  No ACE I secondary to borderline  Hypotension Continue IV Heparin until Mingo Amber, MD  09/27/2012  8:35 AM

## 2012-09-27 NOTE — Progress Notes (Signed)
Progress Note from the Palliative Medicine Team at [facility]  Subjective: patient is intermittently pleasantly confused, sitter at bedside  -call place to daughter Betsy/main caregiver on dc to discuss discharge plan and GOC  --family is still hopeful for dc home as soon as possible, hopeful for today--educated family this will be attending decsion     Objective: No Known Allergies Scheduled Meds: . amiodarone  400 mg Oral BID  . carvedilol  3.125 mg Oral BID WC  . furosemide  40 mg Oral Daily  . sodium chloride  3 mL Intravenous Q12H  . warfarin  3 mg Oral q1800  . Warfarin - Pharmacist Dosing Inpatient   Does not apply q1800   Continuous Infusions: . sodium chloride 10 mL/hr at 09/25/12 2326  . heparin 1,150 Units/hr (09/26/12 2209)   PRN Meds:.sodium chloride, acetaminophen, morphine injection, ondansetron (ZOFRAN) IV, sodium chloride  BP 125/80  Pulse 53  Temp(Src) 97.9 F (36.6 C) (Oral)  Resp 16  Ht 6' (1.829 m)  Wt 105.597 kg (232 lb 12.8 oz)  BMI 31.57 kg/m2  SpO2 96%   PPS:30 %  Pain Score:denies   Intake/Output Summary (Last 24 hours) at 09/27/12 0853 Last data filed at 09/26/12 1836  Gross per 24 hour  Intake    660 ml  Output   1350 ml  Net   -690 ml       Physical Exam: General: chronically ill appearing, NAD  HEENT: Dry buccal membranes, no exudate  Chest: Diminished, scattered course BS CVS: irregular, noted murmur  Abdomen: soft NT +BS  Ext: +1 BLE edema  Neuro: lethargic, oriented to person only    Labs: CBC    Component Value Date/Time   WBC 6.4 09/27/2012 0410   RBC 4.53 09/27/2012 0410   HGB 14.4 09/27/2012 0410   HCT 42.1 09/27/2012 0410   PLT 138* 09/27/2012 0410   MCV 92.9 09/27/2012 0410   MCH 31.8 09/27/2012 0410   MCHC 34.2 09/27/2012 0410   RDW 14.6 09/27/2012 0410   LYMPHSABS 1.0 01/28/2012 1636   MONOABS 0.7 01/28/2012 1636   EOSABS 0.2 01/28/2012 1636   BASOSABS 0.0 01/28/2012 1636    BMET    Component Value Date/Time   NA 138  09/25/2012 0450   K 3.4* 09/25/2012 0450   CL 103 09/25/2012 0450   CO2 26 09/25/2012 0450   GLUCOSE 102* 09/25/2012 0450   BUN 15 09/25/2012 0450   CREATININE 0.88 09/25/2012 0450   CALCIUM 9.1 09/25/2012 0450   GFRNONAA 77* 09/25/2012 0450   GFRAA 90* 09/25/2012 0450    CMP     Component Value Date/Time   NA 138 09/25/2012 0450   K 3.4* 09/25/2012 0450   CL 103 09/25/2012 0450   CO2 26 09/25/2012 0450   GLUCOSE 102* 09/25/2012 0450   BUN 15 09/25/2012 0450   CREATININE 0.88 09/25/2012 0450   CALCIUM 9.1 09/25/2012 0450   PROT 6.6 09/22/2012 0810   ALBUMIN 3.4* 09/22/2012 0810   AST 26 09/22/2012 0810   ALT 15 09/22/2012 0810   ALKPHOS 73 09/22/2012 0810   BILITOT 1.5* 09/22/2012 0810   GFRNONAA 77* 09/25/2012 0450   GFRAA 90* 09/25/2012 0450      Assessment and Plan: 1. Code Status: DNR/DNI 2. Symptom Control:        Dyspnea/Pain: Roxanol 5 mg SL every 3 hrs prn          For dc prescription please write as:  Roxanol/liquid concentration morphine 20 mg/ml            Give 5 mg SL every 3 hrs prn for dyspnea or pain             Dispense quantity  30 ML   3. Psycho/Social: Emotional support offered to daughter.  Encouraged family to use hospice as a resource for questions and concerns on dc   4. Disposition:  Hopeful for dc home with hospice services.  Daughter verbalizes desire to continue patient on anti-coagulation tx.  Discussed with Dr Mayford Knife for best options keeping in mind goals of care being comfort and quality.  Patient Documents Completed or Given: Document Given Completed  Advanced Directives Pkt    MOST    DNR    Gone from [facility]    Hard Choices  yes    Lorinda Creed NP  Palliative Medicine Team Team Phone # [phone] Pager [phone]  Discussed with Dr Mayford Knife 1

## 2012-09-28 LAB — HEPARIN LEVEL (UNFRACTIONATED): Heparin Unfractionated: 0.49 IU/mL (ref 0.30–0.70)

## 2012-09-28 LAB — CBC
HCT: 41.3 % (ref 39.0–52.0)
Hemoglobin: 14.1 g/dL (ref 13.0–17.0)
MCH: 31.9 pg (ref 26.0–34.0)
RBC: 4.42 MIL/uL (ref 4.22–5.81)

## 2012-09-28 LAB — URINE CULTURE: Colony Count: >=100000

## 2012-09-28 LAB — BASIC METABOLIC PANEL
CO2: 28 mEq/L (ref 19–32)
Chloride: 105 mEq/L (ref 96–112)
Creatinine, Ser: 0.86 mg/dL (ref 0.50–1.35)
Glucose, Bld: 103 mg/dL — ABNORMAL HIGH (ref 70–99)
Sodium: 141 mEq/L (ref 135–145)

## 2012-09-28 LAB — PROTIME-INR: Prothrombin Time: 24.7 seconds — ABNORMAL HIGH (ref 11.6–15.2)

## 2012-09-28 MED ORDER — AMIODARONE HCL 400 MG PO TABS: ORAL_TABLET | ORAL | Status: AC

## 2012-09-28 NOTE — Progress Notes (Signed)
ANTICOAGULATION CONSULT NOTE - Follow Up Consult  Pharmacy Consult for Warfarin Indication: atrial fibrillation  No Known Allergies  Labs:  Recent Labs  09/26/12 0500 09/27/12 0410 09/27/12 1044 09/28/12 0400  HGB 14.1 14.4  --  14.1  HCT 41.8 42.1  --  41.3  PLT 142* 138*  --  149*  LABPROT 17.0* 22.0*  --  24.7*  INR 1.42 1.99*  --  2.32*  HEPARINUNFRC 0.38 0.39  --  0.49  CREATININE  --   --  0.98 0.86    Estimated Creatinine Clearance: 81.2 ml/min (by C-G formula based on Cr of 0.86).   Assessment: 79 yo M admitted with ICD firing, interrogation revealing VT/VF. Pharmacy consulted for restart of warfarin after reversal with Vit K 2mg  SQ and hold for expected cardiac cath.  However cath was cancelled.  Patient has also been started on amiodarone.   He is currently on Day#6 of heparin and coumadin overlap. Heparin is therapeutic today at 0.49.  INR = 2.32  Goal of Therapy:  INR 2-3 Heparin level 0.3-0.7 units/ml Monitor platelets by anticoagulation protocol: Yes   Plan:  - Continue Coumadin 3 mg po daily - Have discontinued heparin - Daily INR  Thank you. Okey Regal, PharmD [phone]  09/28/2012 8:29 AM

## 2012-09-28 NOTE — Progress Notes (Addendum)
Follow-up spoke with daughter Tamela Oddi [phone]); she indicated that current DME from [facility] will need to be switched out and requests from [facility] the following DME: Complete Package D: fully electric hospital bed w/ APP mattress and full rails;and over-bed table add to this a light weight wheelchair; and also O2 package B as noted in previous note Call Counce @ [phone] to arrange delivery - [facility] notified and [place] with [facility] called to inform of above   Valente David, RN 09/28/2012, 9:31 AM [facility] Palliative Medicine Team RN Liaison [phone]

## 2012-09-28 NOTE — Progress Notes (Signed)
Pt discharged to home with hospice care per MD order. Pt son at bedside at time of discharge to receive discharge instructions. Pt has history of dementia and confusion. Pt son received all discharge instructions and medication information including follow-up appointments and prescription information. Pt son verbalized understanding. Pt transported to home via [facility].  All belongings sent with pt. Jorge Klein

## 2013-02-20 ENCOUNTER — Telehealth: Payer: Self-pay

## 2013-02-20 NOTE — Telephone Encounter (Signed)
Patient past away @ Home per [employer] in [place] [employer] °

## 2013-02-26 DEATH — deceased
